# Patient Record
Sex: Male | Born: 1950 | Race: White | Hispanic: No | Marital: Single | Smoking: Current every day smoker
Health system: Southern US, Community
[De-identification: ages and names within clinical notes are randomized; demographics above are authoritative.]

## PROBLEM LIST (undated history)

## (undated) DIAGNOSIS — J449 Chronic obstructive pulmonary disease, unspecified: Secondary | ICD-10-CM

## (undated) DIAGNOSIS — R0600 Dyspnea, unspecified: Secondary | ICD-10-CM

## (undated) DIAGNOSIS — I2510 Atherosclerotic heart disease of native coronary artery without angina pectoris: Secondary | ICD-10-CM

## (undated) DIAGNOSIS — M1990 Unspecified osteoarthritis, unspecified site: Secondary | ICD-10-CM

## (undated) DIAGNOSIS — I700 Atherosclerosis of aorta: Secondary | ICD-10-CM

## (undated) DIAGNOSIS — Z9889 Other specified postprocedural states: Secondary | ICD-10-CM

## (undated) DIAGNOSIS — R278 Other lack of coordination: Secondary | ICD-10-CM

## (undated) DIAGNOSIS — Z7982 Long term (current) use of aspirin: Secondary | ICD-10-CM

## (undated) DIAGNOSIS — I6502 Occlusion and stenosis of left vertebral artery: Secondary | ICD-10-CM

## (undated) DIAGNOSIS — K4090 Unilateral inguinal hernia, without obstruction or gangrene, not specified as recurrent: Secondary | ICD-10-CM

## (undated) DIAGNOSIS — E7800 Pure hypercholesterolemia, unspecified: Secondary | ICD-10-CM

## (undated) DIAGNOSIS — L409 Psoriasis, unspecified: Secondary | ICD-10-CM

## (undated) DIAGNOSIS — I779 Disorder of arteries and arterioles, unspecified: Secondary | ICD-10-CM

## (undated) DIAGNOSIS — Z8673 Personal history of transient ischemic attack (TIA), and cerebral infarction without residual deficits: Secondary | ICD-10-CM

## (undated) HISTORY — DX: Psoriasis, unspecified: L40.9

## (undated) HISTORY — PX: TOTAL SHOULDER ARTHROPLASTY: SHX126

## (undated) HISTORY — DX: Occlusion and stenosis of left vertebral artery: I65.02

## (undated) HISTORY — DX: Unilateral inguinal hernia, without obstruction or gangrene, not specified as recurrent: K40.90

## (undated) HISTORY — DX: Other lack of coordination: R27.8

## (undated) HISTORY — PX: SHOULDER SURGERY: SHX246

## (undated) HISTORY — PX: BACK SURGERY: SHX140

## (undated) HISTORY — PX: GANGLION CYST EXCISION: SHX1691

---

## 2005-11-18 ENCOUNTER — Emergency Department: Payer: Self-pay | Admitting: Emergency Medicine

## 2008-02-03 ENCOUNTER — Emergency Department: Payer: Self-pay | Admitting: Emergency Medicine

## 2008-08-04 ENCOUNTER — Emergency Department: Payer: Self-pay | Admitting: Emergency Medicine

## 2009-04-22 ENCOUNTER — Emergency Department: Payer: Self-pay | Admitting: Emergency Medicine

## 2010-06-13 ENCOUNTER — Emergency Department: Payer: Self-pay | Admitting: Emergency Medicine

## 2010-07-08 ENCOUNTER — Emergency Department: Payer: Self-pay | Admitting: Emergency Medicine

## 2010-12-13 ENCOUNTER — Emergency Department: Payer: Self-pay | Admitting: Emergency Medicine

## 2011-01-08 ENCOUNTER — Emergency Department: Payer: Self-pay | Admitting: Emergency Medicine

## 2011-04-22 ENCOUNTER — Emergency Department (HOSPITAL_COMMUNITY)
Admission: EM | Admit: 2011-04-22 | Discharge: 2011-04-23 | Disposition: A | Discharge status: Home / Self Care | Payer: Self-pay | Attending: Emergency Medicine | Admitting: Emergency Medicine

## 2011-04-22 DIAGNOSIS — R21 Rash and other nonspecific skin eruption: Secondary | ICD-10-CM | POA: Insufficient documentation

## 2011-04-22 DIAGNOSIS — L2989 Other pruritus: Secondary | ICD-10-CM | POA: Insufficient documentation

## 2011-04-22 DIAGNOSIS — L298 Other pruritus: Secondary | ICD-10-CM | POA: Insufficient documentation

## 2011-04-30 ENCOUNTER — Emergency Department: Payer: Self-pay | Admitting: Emergency Medicine

## 2011-05-01 ENCOUNTER — Emergency Department (HOSPITAL_COMMUNITY)
Admission: EM | Admit: 2011-05-01 | Discharge: 2011-05-01 | Disposition: A | Discharge status: Home / Self Care | Payer: Self-pay | Attending: Emergency Medicine | Admitting: Emergency Medicine

## 2011-05-01 DIAGNOSIS — L02419 Cutaneous abscess of limb, unspecified: Secondary | ICD-10-CM | POA: Insufficient documentation

## 2011-05-01 DIAGNOSIS — L03119 Cellulitis of unspecified part of limb: Secondary | ICD-10-CM | POA: Insufficient documentation

## 2012-12-01 ENCOUNTER — Emergency Department: Payer: Self-pay | Admitting: Emergency Medicine

## 2012-12-26 ENCOUNTER — Emergency Department: Payer: Self-pay | Admitting: Internal Medicine

## 2012-12-26 LAB — URINALYSIS, COMPLETE
Bacteria: NONE SEEN
Bilirubin,UR: NEGATIVE
Ketone: NEGATIVE
Leukocyte Esterase: NEGATIVE
Ph: 5 (ref 4.5–8.0)
Protein: 30
Squamous Epithelial: NONE SEEN
WBC UR: 1 /HPF (ref 0–5)

## 2012-12-26 LAB — BASIC METABOLIC PANEL
Anion Gap: 7 (ref 7–16)
BUN: 9 mg/dL (ref 7–18)
Calcium, Total: 8.6 mg/dL (ref 8.5–10.1)
Chloride: 104 mmol/L (ref 98–107)
Creatinine: 0.96 mg/dL (ref 0.60–1.30)
Osmolality: 273 (ref 275–301)
Potassium: 3.1 mmol/L — ABNORMAL LOW (ref 3.5–5.1)
Sodium: 136 mmol/L (ref 136–145)

## 2012-12-26 LAB — CBC
HCT: 41.9 % (ref 40.0–52.0)
HGB: 14.3 g/dL (ref 13.0–18.0)
MCH: 32.4 pg (ref 26.0–34.0)
MCV: 95 fL (ref 80–100)
Platelet: 240 10*3/uL (ref 150–440)
RBC: 4.42 10*6/uL (ref 4.40–5.90)
WBC: 13.8 10*3/uL — ABNORMAL HIGH (ref 3.8–10.6)

## 2012-12-26 LAB — LIPASE, BLOOD: Lipase: 67 U/L — ABNORMAL LOW (ref 73–393)

## 2012-12-26 LAB — TROPONIN I: Troponin-I: <0.02 ng/mL

## 2013-07-19 ENCOUNTER — Emergency Department: Payer: Self-pay | Admitting: Emergency Medicine

## 2013-07-19 LAB — CBC
HGB: 13.8 g/dL (ref 13.0–18.0)
MCH: 33 pg (ref 26.0–34.0)
RDW: 13.4 % (ref 11.5–14.5)
WBC: 12.4 10*3/uL — ABNORMAL HIGH (ref 3.8–10.6)

## 2013-07-19 LAB — BASIC METABOLIC PANEL
Chloride: 101 mmol/L (ref 98–107)
EGFR (Non-African Amer.): >60
Glucose: 123 mg/dL — ABNORMAL HIGH (ref 65–99)
Osmolality: 269 (ref 275–301)
Potassium: 3.3 mmol/L — ABNORMAL LOW (ref 3.5–5.1)
Sodium: 135 mmol/L — ABNORMAL LOW (ref 136–145)

## 2015-06-11 ENCOUNTER — Emergency Department: Payer: Self-pay

## 2015-06-11 ENCOUNTER — Emergency Department
Admission: EM | Admit: 2015-06-11 | Discharge: 2015-06-11 | Disposition: A | Discharge status: Home / Self Care | Payer: Self-pay | Attending: Emergency Medicine | Admitting: Emergency Medicine

## 2015-06-11 ENCOUNTER — Encounter: Payer: Self-pay | Admitting: *Deleted

## 2015-06-11 DIAGNOSIS — M545 Low back pain, unspecified: Secondary | ICD-10-CM

## 2015-06-11 DIAGNOSIS — M5136 Other intervertebral disc degeneration, lumbar region: Secondary | ICD-10-CM | POA: Insufficient documentation

## 2015-06-11 DIAGNOSIS — Z720 Tobacco use: Secondary | ICD-10-CM | POA: Insufficient documentation

## 2015-06-11 LAB — URINALYSIS COMPLETE WITH MICROSCOPIC (ARMC ONLY)
BILIRUBIN URINE: NEGATIVE
Bacteria, UA: NONE SEEN
Glucose, UA: NEGATIVE mg/dL
Hgb urine dipstick: NEGATIVE
KETONES UR: NEGATIVE mg/dL
LEUKOCYTES UA: NEGATIVE
Nitrite: NEGATIVE
PROTEIN: NEGATIVE mg/dL
SPECIFIC GRAVITY, URINE: 1.003 — AB (ref 1.005–1.030)
Squamous Epithelial / LPF: NONE SEEN
pH: 7 (ref 5.0–8.0)

## 2015-06-11 MED ORDER — MELOXICAM 15 MG PO TABS: 15.0000 mg | ORAL_TABLET | Freq: Every day | ORAL | Status: DC

## 2015-06-11 MED ORDER — ORPHENADRINE CITRATE 30 MG/ML IJ SOLN
60.0000 mg | Freq: Two times a day (BID) | INTRAMUSCULAR | Status: DC
  Administered 2015-06-11: 60 mg via INTRAMUSCULAR
  Filled 2015-06-11: qty 2

## 2015-06-11 MED ORDER — TRAMADOL HCL 50 MG PO TABS: 50.0000 mg | ORAL_TABLET | Freq: Four times a day (QID) | ORAL | Status: DC | PRN

## 2015-06-11 MED ORDER — KETOROLAC TROMETHAMINE 60 MG/2ML IM SOLN
60.0000 mg | Freq: Once | INTRAMUSCULAR | Status: AC
  Administered 2015-06-11: 60 mg via INTRAMUSCULAR
  Filled 2015-06-11: qty 2

## 2015-06-11 MED ORDER — CYCLOBENZAPRINE HCL 10 MG PO TABS: 10.0000 mg | ORAL_TABLET | Freq: Three times a day (TID) | ORAL | Status: DC | PRN

## 2015-06-11 NOTE — Discharge Instructions (Signed)

## 2015-06-11 NOTE — ED Provider Notes (Signed)
[facility] Emergency Department Provider Note  ____________________________________________  Time seen: Approximately 8:46 PM  I have reviewed the triage vital signs and the nursing notes.   HISTORY  Chief Complaint Back Pain    HPI Tommy Avila is a 64 y.o. male patient complaining of right-sided back pain for 2 days. Patient denies any injury however there was a prolonged flexion incident and when he straighten out the pain started. Patient also complaining of urinary frequency status post onset of back pain. Patient denies any hematuria or abdominal pain. Patient is rating his pain as a 10 over 10. Patient is taking over-the-counter ibuprofen with no relief. Patient denies any bowel dysfunction. Patient denies any radicular component to this pain.   History reviewed. No pertinent past medical history.  There are no active problems to display for this patient.   History reviewed. No pertinent past surgical history.  No current outpatient prescriptions on file.  Allergies Review of patient's allergies indicates no known allergies.  History reviewed. No pertinent family history.  Social History Social History  Substance Use Topics  . Smoking status: Current Every Day Smoker  . Smokeless tobacco: None  . Alcohol Use: No    Review of Systems Constitutional: No fever/chills Eyes: No visual changes. ENT: No sore throat. Cardiovascular: Denies chest pain. Respiratory: Denies shortness of breath. Gastrointestinal: No abdominal pain.  No nausea, no vomiting.  No diarrhea.  No constipation. Genitourinary: Negative for dysuria. Musculoskeletal: Acute right back pain Skin: Negative for rash. Neurological: Negative for headaches, focal weakness or numbness. 10-point ROS otherwise negative.  ____________________________________________   PHYSICAL EXAM:  VITAL SIGNS: ED Triage Vitals  Enc Vitals Group     BP 06/11/15 2025 130/68 mmHg      Pulse Rate 06/11/15 2025 64     Resp 06/11/15 2025 20     Temp 06/11/15 2025 98 F (36.7 C)     Temp Source 06/11/15 2025 Oral     SpO2 06/11/15 2025 99 %     Weight 06/11/15 2025 150 lb (68.04 kg)     Height 06/11/15 2025 6\' 1"  (1.854 m)     Head Cir --      Peak Flow --      Pain Score 06/11/15 2027 10     Pain Loc --      Pain Edu? --      Excl. in [place]? --     Constitutional: Alert and oriented. Well appearing and in no acute distress. Eyes: Conjunctivae are normal. PERRL. EOMI. Head: Atraumatic. Nose: No congestion/rhinnorhea. Mouth/Throat: Mucous membranes are moist.  Oropharynx non-erythematous. Neck: No stridor.  No cervical spine tenderness to palpation. Hematological/Lymphatic/Immunilogical: No cervical lymphadenopathy. Cardiovascular: Normal rate, regular rhythm. Grossly normal heart sounds.  Good peripheral circulation. Respiratory: Normal respiratory effort.  No retractions. Lungs CTAB. Gastrointestinal: Soft and nontender. No distention. No abdominal bruits. No CVA tenderness. Musculoskeletal: No spinal deformity. Patient decreased range of motion with extension of the L-spine. Patient has some moderate guarding to palpation L3-L5. Left paraspinal muscle spasm with attempt to come to fully extension.. Neurologic:  Normal speech and language. No gross focal neurologic deficits are appreciated. No gait instability. Skin:  Skin is warm, dry and intact. No rash noted. Psychiatric: Mood and affect are normal. Speech and behavior are normal.  ____________________________________________   LABS (all labs ordered are listed, but only abnormal results are displayed)  Labs Reviewed  URINALYSIS COMPLETEWITH MICROSCOPIC ([place]) - Abnormal; Notable for the following:  Color, Urine STRAW (*)    APPearance CLEAR (*)    Specific Gravity, Urine 1.003 (*)    All other components within normal limits    ____________________________________________  EKG   ____________________________________________  RADIOLOGY No acute findings. Mild multilevel degenerative disc disease. I, Sable Feil, personally viewed and evaluated these images (plain radiographs) as part of my medical decision making.   ____________________________________________   PROCEDURES  Procedure(s) performed: None  Critical Care performed: No  ____________________________________________   INITIAL IMPRESSION / ASSESSMENT AND PLAN / ED COURSE  Pertinent labs & imaging results that were available during my care of the patient were reviewed by me and considered in my medical decision making (see chart for details).  Acute low back pain with mild degenerative disc disease. Patient get a prescription for Motrin, Flexeril, and tramadol. Patient advised follow-up with family doctor for continued care. ____________________________________________   FINAL CLINICAL IMPRESSION(S) / ED DIAGNOSES  Final diagnoses:  Back pain at L4-L5 level      Sable Feil, PA-C 06/11/15 2146  Lavonia Drafts, MD 06/11/15 2256

## 2015-06-11 NOTE — ED Notes (Signed)
Pt reports right side back pain.  Pt also reports urinary frequency.  No abd pain. Pt denies injury to back.

## 2016-10-05 ENCOUNTER — Telehealth: Payer: Self-pay

## 2016-10-05 ENCOUNTER — Other Ambulatory Visit: Payer: Self-pay | Admitting: Family Medicine

## 2016-10-05 DIAGNOSIS — Z87891 Personal history of nicotine dependence: Secondary | ICD-10-CM

## 2016-10-05 NOTE — Telephone Encounter (Signed)
Received referral from pt PCP to schedule. L MOM to call back and schedule.

## 2016-10-07 ENCOUNTER — Ambulatory Visit
Admission: RE | Admit: 2016-10-07 | Discharge: 2016-10-07 | Disposition: A | Discharge status: Home / Self Care | Payer: Medicare HMO | Source: Ambulatory Visit | Attending: Family Medicine | Admitting: Family Medicine

## 2016-10-07 DIAGNOSIS — I77819 Aortic ectasia, unspecified site: Secondary | ICD-10-CM | POA: Diagnosis not present

## 2016-10-07 DIAGNOSIS — I700 Atherosclerosis of aorta: Secondary | ICD-10-CM | POA: Insufficient documentation

## 2016-10-07 DIAGNOSIS — Z87891 Personal history of nicotine dependence: Secondary | ICD-10-CM | POA: Diagnosis not present

## 2016-10-13 ENCOUNTER — Ambulatory Visit: Payer: Self-pay | Admitting: Cardiology

## 2016-10-13 ENCOUNTER — Encounter: Payer: Self-pay | Admitting: *Deleted

## 2017-02-05 ENCOUNTER — Emergency Department: Payer: Medicare HMO

## 2017-02-05 DIAGNOSIS — Z5321 Procedure and treatment not carried out due to patient leaving prior to being seen by health care provider: Secondary | ICD-10-CM | POA: Insufficient documentation

## 2017-02-05 DIAGNOSIS — R079 Chest pain, unspecified: Secondary | ICD-10-CM | POA: Insufficient documentation

## 2017-02-05 LAB — CBC
HEMATOCRIT: 40.5 % (ref 40.0–52.0)
Hemoglobin: 14.1 g/dL (ref 13.0–18.0)
MCH: 33.1 pg (ref 26.0–34.0)
MCHC: 34.7 g/dL (ref 32.0–36.0)
MCV: 95.5 fL (ref 80.0–100.0)
PLATELETS: 257 10*3/uL (ref 150–440)
RBC: 4.24 MIL/uL — ABNORMAL LOW (ref 4.40–5.90)
RDW: 12.9 % (ref 11.5–14.5)
WBC: 10.6 10*3/uL (ref 3.8–10.6)

## 2017-02-05 LAB — BASIC METABOLIC PANEL
Anion gap: 6 (ref 5–15)
BUN: 15 mg/dL (ref 6–20)
CHLORIDE: 104 mmol/L (ref 101–111)
CO2: 28 mmol/L (ref 22–32)
CREATININE: 0.97 mg/dL (ref 0.61–1.24)
Calcium: 9 mg/dL (ref 8.9–10.3)
GFR calc Af Amer: >60 mL/min (ref >60)
GFR calc non Af Amer: >60 mL/min (ref >60)
GLUCOSE: 110 mg/dL — AB (ref 65–99)
POTASSIUM: 3.3 mmol/L — AB (ref 3.5–5.1)
SODIUM: 138 mmol/L (ref 135–145)

## 2017-02-05 LAB — TROPONIN I: Troponin I: <0.03 ng/mL (ref <0.03)

## 2017-02-05 NOTE — ED Triage Notes (Signed)
Reports sudden onset of left sided chest pain that radiates into his left arm.  Reports chest pain as pressure and that left arm went numb.

## 2017-02-06 ENCOUNTER — Emergency Department
Admission: EM | Admit: 2017-02-06 | Discharge: 2017-02-06 | Disposition: A | Discharge status: Home / Self Care | Payer: Medicare HMO | Attending: Emergency Medicine | Admitting: Emergency Medicine

## 2017-11-02 ENCOUNTER — Encounter: Payer: Self-pay | Admitting: General Surgery

## 2017-11-13 ENCOUNTER — Other Ambulatory Visit: Payer: Self-pay

## 2017-11-13 ENCOUNTER — Emergency Department: Payer: Medicare HMO

## 2017-11-13 ENCOUNTER — Encounter: Payer: Self-pay | Admitting: Emergency Medicine

## 2017-11-13 ENCOUNTER — Emergency Department
Admission: EM | Admit: 2017-11-13 | Discharge: 2017-11-13 | Disposition: A | Discharge status: Home / Self Care | Payer: Medicare HMO | Attending: Emergency Medicine | Admitting: Emergency Medicine

## 2017-11-13 DIAGNOSIS — M545 Low back pain, unspecified: Secondary | ICD-10-CM

## 2017-11-13 DIAGNOSIS — F17200 Nicotine dependence, unspecified, uncomplicated: Secondary | ICD-10-CM | POA: Diagnosis not present

## 2017-11-13 LAB — URINALYSIS, COMPLETE (UACMP) WITH MICROSCOPIC
BACTERIA UA: NONE SEEN
Bilirubin Urine: NEGATIVE
Glucose, UA: NEGATIVE mg/dL
Ketones, ur: NEGATIVE mg/dL
Leukocytes, UA: NEGATIVE
Nitrite: NEGATIVE
PH: 5 (ref 5.0–8.0)
Protein, ur: NEGATIVE mg/dL
SPECIFIC GRAVITY, URINE: 1.01 (ref 1.005–1.030)
SQUAMOUS EPITHELIAL / LPF: NONE SEEN

## 2017-11-13 MED ORDER — CYCLOBENZAPRINE HCL 5 MG PO TABS: ORAL_TABLET | ORAL | 0 refills | Status: DC

## 2017-11-13 MED ORDER — MELOXICAM 15 MG PO TABS: 15.0000 mg | ORAL_TABLET | Freq: Every day | ORAL | 0 refills | Status: AC

## 2017-11-13 NOTE — ED Provider Notes (Signed)
[facility] Emergency Department Provider Note  ____________________________________________  Time seen: Approximately 1:44 PM  I have reviewed the triage vital signs and the nursing notes.   HISTORY  Chief Complaint Back Pain    HPI Tommy Avila is a 67 y.o. male that presents to the emergency department for evaluation of bilateral low back pain for 7 days.  Pain occasionally radiates into his right hip.  He does not have any hip pain currently.  No trauma.  Patient has had 2 back surgeries in the past.  He was urinating frequently last night.  No bowel or bladder dysfunction or saddle paresthesias.  No alleviating measures have been attempted.  No fever, chills, nausea, vomiting, abdominal pain, flank pain, hematuria, diarrhea, constipation.   History reviewed. No pertinent past medical history.  There are no active problems to display for this patient.   Past Surgical History:  Procedure Laterality Date  . BACK SURGERY      Prior to Admission medications   Medication Sig Start Date End Date Taking? Authorizing Provider  cyclobenzaprine (FLEXERIL) 5 MG tablet Take 1-2 tablets 3 times daily as needed 11/13/17   Laban Emperor, PA-C  meloxicam (MOBIC) 15 MG tablet Take 1 tablet (15 mg total) by mouth daily for 10 days. 11/13/17 11/23/17  Laban Emperor, PA-C  traMADol (ULTRAM) 50 MG tablet Take 1 tablet (50 mg total) by mouth every 6 (six) hours as needed for moderate pain. 06/11/15   Sable Feil, PA-C    Allergies Patient has no known allergies.  No family history on file.  Social History Social History   Tobacco Use  . Smoking status: Current Every Day Smoker  . Smokeless tobacco: Never Used  Substance Use Topics  . Alcohol use: No  . Drug use: Not on file     Review of Systems  Cardiovascular: No chest pain. Respiratory:  No SOB. Gastrointestinal: No abdominal pain.  No nausea, no vomiting.  Musculoskeletal: Positive for back and  hip pain. Skin: Negative for rash, abrasions, lacerations, ecchymosis. Neurological: Negative for headaches, numbness or tingling   ____________________________________________   PHYSICAL EXAM:  VITAL SIGNS: ED Triage Vitals  Enc Vitals Group     BP 11/13/17 1225 124/72     Pulse Rate 11/13/17 1225 62     Resp 11/13/17 1225 16     Temp --      Temp src --      SpO2 11/13/17 1225 100 %     Weight 11/13/17 1226 150 lb (68 kg)     Height 11/13/17 1226 6\' 1"  (1.854 m)     Head Circumference --      Peak Flow --      Pain Score 11/13/17 1226 9     Pain Loc --      Pain Edu? --      Excl. in [place]? --      Constitutional: Alert and oriented. Well appearing and in no acute distress. Eyes: Conjunctivae are normal. PERRL. EOMI. Head: Atraumatic. ENT:      Ears:      Nose: No congestion/rhinnorhea.      Mouth/Throat: Mucous membranes are moist.  Neck: No stridor.  Cardiovascular: Normal rate, regular rhythm.  Good peripheral circulation. Respiratory: Normal respiratory effort without tachypnea or retractions. Lungs CTAB. Good air entry to the bases with no decreased or absent breath sounds. Gastrointestinal: Bowel sounds 4 quadrants. Soft and nontender to palpation. No guarding or rigidity. No palpable masses. No  distention. No CVA tenderness. Musculoskeletal: Full range of motion to all extremities. No gross deformities appreciated.  Lumbar paraspinal tenderness to palpation.  Strength equal in lower extremities bilaterally.  Normal gait. Neurologic:  Normal speech and language. No gross focal neurologic deficits are appreciated.  Skin:  Skin is warm, dry and intact. No rash noted. Psychiatric: Mood and affect are normal. Speech and behavior are normal. Patient exhibits appropriate insight and judgement.   ____________________________________________   LABS (all labs ordered are listed, but only abnormal results are displayed)  Labs Reviewed  URINALYSIS, COMPLETE (UACMP)  WITH MICROSCOPIC - Abnormal; Notable for the following components:      Result Value   Color, Urine STRAW (*)    APPearance CLEAR (*)    Hgb urine dipstick SMALL (*)    All other components within normal limits   ____________________________________________  EKG   ____________________________________________  RADIOLOGY Robinette Haines, personally viewed and evaluated these images (plain radiographs) as part of my medical decision making, as well as reviewing the written report by the radiologist.  Dg Lumbar Spine Complete  Result Date: 11/13/2017 CLINICAL DATA:  Low back pain beginning 1 week ago.  Right hip pain. EXAM: LUMBAR SPINE - COMPLETE 4+ VIEW COMPARISON:  06/11/2015 FINDINGS: Normal alignment. Chronic disc space narrowing at L3-4, L4-5 and L5-S1 with marginal osteophytes. Chronic lower lumbar facet osteoarthritis. No acute finding. IMPRESSION: No acute finding. 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 Electronically Signed   By: Nelson Chimes M.D.   On: 11/13/2017 14:45    ____________________________________________    PROCEDURES  Procedure(s) performed:    Procedures    Medications - No data to display   ____________________________________________   INITIAL IMPRESSION / ASSESSMENT AND PLAN / ED COURSE  Pertinent labs & imaging results that were available during my care of the patient were reviewed by me and considered in my medical decision making (see chart for details).  Review of the [place] CSRS was performed in accordance of the [place] prior to dispensing any controlled drugs.     Patient presented to emergency department for evaluation of low back pain for 1 week.  Vital signs and exam are reassuring.  X-ray consistent with chronic changes.  No bowel or bladder dysfunction or saddle paresthesias.  Patient is up walking comfortably in ED.  Urinalysis shows small hgb and patient will follow up with PCP for repeat  urinalysis.  Symptoms are not consistent with kidney stone. Patient will be discharged home with prescriptions for flexeril and mobic. Patient is to follow up with PCP as directed. Patient is given ED precautions to return to the ED for any worsening or new symptoms.     ____________________________________________  FINAL CLINICAL IMPRESSION(S) / ED DIAGNOSES  Final diagnoses:  Acute bilateral low back pain without sciatica      NEW MEDICATIONS STARTED DURING THIS VISIT:  ED Discharge Orders        Ordered    meloxicam (MOBIC) 15 MG tablet  Daily     11/13/17 1514    cyclobenzaprine (FLEXERIL) 5 MG tablet     11/13/17 1514          This chart was dictated using voice recognition software/Dragon. Despite best efforts to proofread, errors can occur which can change the meaning. Any change was purely unintentional.    Laban Emperor, PA-C 11/13/17 1647    Earleen Newport, MD 11/14/17 [phone]

## 2017-11-13 NOTE — ED Triage Notes (Signed)
Says low back pain since last Monday.  Says he does not recall injury.  Says pain in hip on right as well.

## 2018-02-14 ENCOUNTER — Other Ambulatory Visit: Payer: Self-pay

## 2018-02-14 DIAGNOSIS — Z1211 Encounter for screening for malignant neoplasm of colon: Secondary | ICD-10-CM

## 2018-02-20 ENCOUNTER — Other Ambulatory Visit: Payer: Self-pay

## 2018-03-02 ENCOUNTER — Telehealth: Payer: Self-pay

## 2018-03-02 NOTE — Telephone Encounter (Signed)
Contacted pt to reschedule colonoscopy.  He has agreed to reschedule to 03/26/18. Kieth Brightly in Endo has been informed, updated referral.  Thanks Sharyn Lull

## 2018-03-23 ENCOUNTER — Encounter: Payer: Self-pay | Admitting: Emergency Medicine

## 2018-03-26 ENCOUNTER — Encounter: Admission: RE | Payer: Self-pay | Source: Ambulatory Visit

## 2018-03-26 ENCOUNTER — Ambulatory Visit: Admission: RE | Admit: 2018-03-26 | Payer: Medicare HMO | Source: Ambulatory Visit | Admitting: Gastroenterology

## 2018-03-26 SURGERY — COLONOSCOPY WITH PROPOFOL
Anesthesia: General

## 2018-07-24 ENCOUNTER — Other Ambulatory Visit: Payer: Self-pay | Admitting: Family Medicine

## 2018-07-24 DIAGNOSIS — Z0000 Encounter for general adult medical examination without abnormal findings: Secondary | ICD-10-CM

## 2018-09-04 ENCOUNTER — Other Ambulatory Visit: Payer: Self-pay

## 2018-09-04 ENCOUNTER — Emergency Department: Payer: Medicare HMO

## 2018-09-04 ENCOUNTER — Encounter: Payer: Self-pay | Admitting: Emergency Medicine

## 2018-09-04 ENCOUNTER — Emergency Department
Admission: EM | Admit: 2018-09-04 | Discharge: 2018-09-04 | Disposition: A | Discharge status: Home / Self Care | Payer: Medicare HMO | Attending: Emergency Medicine | Admitting: Emergency Medicine

## 2018-09-04 DIAGNOSIS — F17210 Nicotine dependence, cigarettes, uncomplicated: Secondary | ICD-10-CM | POA: Insufficient documentation

## 2018-09-04 DIAGNOSIS — M25512 Pain in left shoulder: Secondary | ICD-10-CM | POA: Diagnosis not present

## 2018-09-04 DIAGNOSIS — J449 Chronic obstructive pulmonary disease, unspecified: Secondary | ICD-10-CM | POA: Diagnosis not present

## 2018-09-04 HISTORY — DX: Chronic obstructive pulmonary disease, unspecified: J44.9

## 2018-09-04 MED ORDER — TRAMADOL HCL 50 MG PO TABS: 50.0000 mg | ORAL_TABLET | Freq: Four times a day (QID) | ORAL | 0 refills | Status: DC | PRN

## 2018-09-04 MED ORDER — PREDNISONE 10 MG (21) PO TBPK: ORAL_TABLET | ORAL | 0 refills | Status: DC

## 2018-09-04 NOTE — Discharge Instructions (Addendum)
Follow-up with your regular doctor if not better in 5 to 7 days.  Or you may see [facility] orth[facility].  He is call for appointment.  Take medications as prescribed.  Apply ice to the left shoulder.  Return to the emergency department if worsening.

## 2018-09-04 NOTE — ED Triage Notes (Addendum)
Pt c/o left shoulder pain for 3 weeks. Pain worse with movement.  Minimal ROM r/t pain.  No known injury.  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  Has had difficulty working r/o pain.  Has been using right hand/arm.  When pt raises arm at shoulder feels pain in the joint.  No chest pain or SHOB.

## 2018-09-04 NOTE — ED Provider Notes (Signed)
[facility] Emergency Department Provider Note  ____________________________________________   First MD Initiated Contact with Patient 09/04/18 1353     (approximate)  I have reviewed the triage vital signs and the nursing notes.   HISTORY  Chief Complaint Shoulder Pain    HPI JENSON BEEDLE is a 68 y.o. male presents emergency department complaining of left shoulder pain for 3 weeks.  Pain is worse with movement.  States he cannot reach overhead or across his chest.  States he has to lift the arm with his other hand at night when he rolls over on it.  States pain is located directly in the joint.  He denies any chest pain/shortness of breath.  No vomiting or diarrhea.    Past Medical History:  Diagnosis Date  . COPD (chronic obstructive pulmonary disease) (HCC)     There are no active problems to display for this patient.   Past Surgical History:  Procedure Laterality Date  . BACK SURGERY      Prior to Admission medications   Medication Sig Start Date End Date Taking? Authorizing Provider  predniSONE (STERAPRED UNI-PAK 21 TAB) 10 MG (21) TBPK tablet Take 6 pills on day one then decrease by 1 pill each day 09/04/18   Versie Starks, PA-C  traMADol (ULTRAM) 50 MG tablet Take 1 tablet (50 mg total) by mouth every 6 (six) hours as needed for moderate pain. 09/04/18   Versie Starks, PA-C    Allergies Patient has no known allergies.  History reviewed. No pertinent family history.  Social History Social History   Tobacco Use  . Smoking status: Current Every Day Smoker  . Smokeless tobacco: Never Used  Substance Use Topics  . Alcohol use: No  . Drug use: Not on file    Review of Systems  Constitutional: No fever/chills Eyes: No visual changes. ENT: No sore throat. Respiratory: Denies cough Genitourinary: Negative for dysuria. Musculoskeletal: Negative for back pain.  Positive for left shoulder pain Skin: Negative for  rash.    ____________________________________________   PHYSICAL EXAM:  VITAL SIGNS: ED Triage Vitals  Enc Vitals Group     BP 09/04/18 1340 115/74     Pulse Rate 09/04/18 1340 82     Resp 09/04/18 1340 16     Temp 09/04/18 1340 97.6 F (36.4 C)     Temp Source 09/04/18 1340 Oral     SpO2 09/04/18 1340 98 %     Weight 09/04/18 1337 155 lb (70.3 kg)     Height 09/04/18 1337 6' (1.829 m)     Head Circumference --      Peak Flow --      Pain Score 09/04/18 1337 8     Pain Loc --      Pain Edu? --      Excl. in [place]? --     Constitutional: Alert and oriented. Well appearing and in no acute distress. Eyes: Conjunctivae are normal.  Head: Atraumatic. Nose: No congestion/rhinnorhea. Mouth/Throat: Mucous membranes are moist.   Neck:  supple no lymphadenopathy noted Cardiovascular: Normal rate, regular rhythm. Heart sounds are normal Respiratory: Normal respiratory effort.  No retractions, lungs c t a  GU: deferred Musculoskeletal: FROM all extremities, warm and well perfused, pain is reproduced with overhead reach and abduction.  Positive Hawkins sign.  The joint is tender anteriorly and at the joint space laterally.  Neurovascular is intact. Neurologic:  Normal speech and language.  Skin:  Skin is warm, dry  and intact. No rash noted. Psychiatric: Mood and affect are normal. Speech and behavior are normal.  ____________________________________________   LABS (all labs ordered are listed, but only abnormal results are displayed)  Labs Reviewed - No data to display ____________________________________________   ____________________________________________  RADIOLOGY  X-ray of the left shoulder is negative  ____________________________________________   PROCEDURES  Procedure(s) performed: EKG from triage shows normal sinus rhythm  Procedures    ____________________________________________   INITIAL IMPRESSION / ASSESSMENT AND PLAN / ED COURSE  Pertinent  labs & imaging results that were available during my care of the patient were reviewed by me and considered in my medical decision making (see chart for details).   Patient is a 68 year old male presents emergency department complaint of left shoulder pain.  For 3 weeks  Physical exam shows tenderness at the left shoulder anteriorly and laterally.  Positive Hawkins sign.  Pain is reproduced with overhead reach.  X-ray left shoulder is negative EKG from triage shows normal sinus rhythm  Explained the findings to the patient.  He was given a prescription for Sterapred and tramadol.  He is to follow-up with orthopedics.  Apply ice to the left shoulder.  Perform range of motion exercises as to decrease the chances of a frozen shoulder.  He states he understands and will comply.  He was discharged in stable condition.     As part of my medical decision making, I reviewed the following data within the [place] notes reviewed and incorporated, EKG interpreted NSR, Old chart reviewed, Radiograph reviewed left shoulder x-ray is negative, Notes from prior ED visits and [place] Controlled Substance Database  ____________________________________________   FINAL CLINICAL IMPRESSION(S) / ED DIAGNOSES  Final diagnoses:  Acute pain of left shoulder      NEW MEDICATIONS STARTED DURING THIS VISIT:  New Prescriptions   PREDNISONE (STERAPRED UNI-PAK 21 TAB) 10 MG (21) TBPK TABLET    Take 6 pills on day one then decrease by 1 pill each day     Note:  This document was prepared using Dragon voice recognition software and may include unintentional dictation errors.    Versie Starks, PA-C 09/04/18 1449    Eula Listen, MD 09/04/18 [phone]

## 2018-09-11 ENCOUNTER — Other Ambulatory Visit: Payer: Medicare HMO

## 2019-04-25 ENCOUNTER — Ambulatory Visit: Payer: Self-pay | Admitting: General Surgery

## 2019-05-02 ENCOUNTER — Encounter: Payer: Self-pay | Admitting: General Surgery

## 2019-05-02 ENCOUNTER — Other Ambulatory Visit: Payer: Self-pay

## 2019-05-02 ENCOUNTER — Ambulatory Visit (INDEPENDENT_AMBULATORY_CARE_PROVIDER_SITE_OTHER): Payer: Medicare HMO | Admitting: General Surgery

## 2019-05-02 VITALS — BP 121/77 | HR 68 | Temp 97.2°F | Ht 73.0 in | Wt 151.2 lb

## 2019-05-02 DIAGNOSIS — K4090 Unilateral inguinal hernia, without obstruction or gangrene, not specified as recurrent: Secondary | ICD-10-CM

## 2019-05-02 NOTE — Patient Instructions (Addendum)
You have chose to have your hernia repaired. This will be done by Dr. Celine Ahr at [facility].  Please see your (blue) Pre-care information that you have been given today.  You will need to arrange to be out of work for 2 weeks and then return with a lifting restrictions for 4 more weeks. Please send any FMLA paperwork prior to surgery and we will fill this out and fax it back to your employer within 3 business days.  You may have a bruise in your groin and also swelling and brusing in your testicle area. You may use ice 4-5 times daily for 15-20 minutes each time. Make sure that you place a barrier between you and the ice pack. To decrease the swelling, you may roll up a bath towel and place it vertically in between your thighs with your testicles resting on the towel. You will want to keep this area elevated as much as possible for several days following surgery.    Inguinal Hernia, Adult Muscles help keep everything in the body in its proper place. But if a weak spot in the muscles develops, something can poke through. That is called a hernia. When this happens in the lower part of the belly (abdomen), it is called an inguinal hernia. (It takes its name from a part of the body in this region called the inguinal canal.) A weak spot in the wall of muscles lets some fat or part of the small intestine bulge through. An inguinal hernia can develop at any age. Men get them more often than women. CAUSES  In adults, an inguinal hernia develops over time.  It can be triggered by:  Suddenly straining the muscles of the lower abdomen.  Lifting heavy objects.  Straining to have a bowel movement. Difficult bowel movements (constipation) can lead to this.  Constant coughing. This may be caused by smoking or lung disease.  Being overweight.  Being pregnant.  Working at a job that requires long periods of standing or heavy lifting.  Having had an inguinal hernia before. One type can be an emergency  situation. It is called a strangulated inguinal hernia. It develops if part of the small intestine slips through the weak spot and cannot get back into the abdomen. The blood supply can be cut off. If that happens, part of the intestine may die. This situation requires emergency surgery. SYMPTOMS  Often, a small inguinal hernia has no symptoms. It is found when a healthcare provider does a physical exam. Larger hernias usually have symptoms.   In adults, symptoms may include:  A lump in the groin. This is easier to see when the person is standing. It might disappear when lying down.  In men, a lump in the scrotum.  Pain or burning in the groin. This occurs especially when lifting, straining or coughing.  A dull ache or feeling of pressure in the groin.  Signs of a strangulated hernia can include:  A bulge in the groin that becomes very painful and tender to the touch.  A bulge that turns red or purple.  Fever, nausea and vomiting.  Inability to have a bowel movement or to pass gas. DIAGNOSIS  To decide if you have an inguinal hernia, a healthcare provider will probably do a physical examination.  This will include asking questions about any symptoms you have noticed.  The healthcare provider might feel the groin area and ask you to cough. If an inguinal hernia is felt, the healthcare provider may try  to slide it back into the abdomen.  Usually no other tests are needed. TREATMENT  Treatments can vary. The size of the hernia makes a difference. Options include:  Watchful waiting. This is often suggested if the hernia is small and you have had no symptoms.  No medical procedure will be done unless symptoms develop.  You will need to watch closely for symptoms. If any occur, contact your healthcare provider right away.  Surgery. This is used if the hernia is larger or you have symptoms.  Open surgery. This is usually an outpatient procedure (you will not stay overnight in a  hospital). An cut (incision) is made through the skin in the groin. The hernia is put back inside the abdomen. The weak area in the muscles is then repaired by herniorrhaphy or hernioplasty. Herniorrhaphy: in this type of surgery, the weak muscles are sewn back together. Hernioplasty: a patch or mesh is used to close the weak area in the abdominal wall.  Laparoscopy. In this procedure, a surgeon makes small incisions. A thin tube with a tiny video camera (called a laparoscope) is put into the abdomen. The surgeon repairs the hernia with mesh by looking with the video camera and using two long instruments. HOME CARE INSTRUCTIONS   After surgery to repair an inguinal hernia:  You will need to take pain medicine prescribed by your healthcare provider. Follow all directions carefully.  You will need to take care of the wound from the incision.  Your activity will be restricted for awhile. This will probably include no heavy lifting for several weeks. You also should not do anything too active for a few weeks. When you can return to work will depend on the type of job that you have.  During "watchful waiting" periods, you should:  Maintain a healthy weight.  Eat a diet high in fiber (fruits, vegetables and whole grains).  Drink plenty of fluids to avoid constipation. This means drinking enough water and other liquids to keep your urine clear or pale yellow.  Do not lift heavy objects.  Do not stand for long periods of time.  Quit smoking. This should keep you from developing a frequent cough. SEEK MEDICAL CARE IF:   A bulge develops in your groin area.  You feel pain, a burning sensation or pressure in the groin. This might be worse if you are lifting or straining.  You develop a fever of more than 100.5 F (38.1 C). SEEK IMMEDIATE MEDICAL CARE IF:   Pain in the groin increases suddenly.  A bulge in the groin gets bigger suddenly and does not go down.  For men, there is sudden pain  in the scrotum. Or, the size of the scrotum increases.  A bulge in the groin area becomes red or purple and is painful to touch.  You have nausea or vomiting that does not go away.  You feel your heart beating much faster than normal.  You cannot have a bowel movement or pass gas.  You develop a fever of more than 102.0 F (38.9 C).   This information is not intended to replace advice given to you by your health care provider. Make sure you discuss any questions you have with your health care provider.   Document Released: 12/18/2008 Document Revised: 10/24/2011 Document Reviewed: 02/02/2015 Elsevier Interactive Patient Education 2016 Elsevier Inc.   Inguinal Hernia, Adult An inguinal hernia is when fat or your intestines push through a weak spot in a muscle where your leg  meets your lower belly (groin). This causes a rounded lump (bulge). This kind of hernia could also be:  In your scrotum, if you are male.  In folds of skin around your vagina, if you are male. There are three types of inguinal hernias. These include:  Hernias that can be pushed back into the belly (are reducible). This type rarely causes pain.  Hernias that cannot be pushed back into the belly (are incarcerated).  Hernias that cannot be pushed back into the belly and lose their blood supply (are strangulated). This type needs emergency surgery. If you do not have symptoms, you may not need treatment. If you have symptoms or a large hernia, you may need surgery. Follow these instructions at home: Lifestyle  Do these things if told by your doctor so you do not have trouble pooping (constipation): ? Drink enough fluid to keep your pee (urine) pale yellow. ? Eat foods that have a lot of fiber. These include fresh fruits and vegetables, whole grains, and beans. ? Limit foods that are high in fat and processed sugars. These include foods that are fried or sweet. ? Take medicine for trouble pooping.  Avoid  lifting heavy objects.  Avoid standing for long amounts of time.  Do not use any products that contain nicotine or tobacco. These include cigarettes and e-cigarettes. If you need help quitting, ask your doctor.  Stay at a healthy weight. General instructions  You may try to push your hernia in by very gently pressing on it when you are lying down. Do not try to force the bulge back in if it will not push in easily.  Watch your hernia for any changes in shape, size, or color. Tell your doctor if you see any changes.  Take over-the-counter and prescription medicines only as told by your doctor.  Keep all follow-up visits as told by your doctor. This is important. Contact a doctor if:  You have a fever.  You have new symptoms.  Your symptoms get worse. Get help right away if:  The area where your leg meets your lower belly has: ? Pain that gets worse suddenly. ? A bulge that gets bigger suddenly, and it does not get smaller after that. ? A bulge that turns red or purple. ? A bulge that is painful when you touch it.  You are a man, and your scrotum: ? Suddenly feels painful. ? Suddenly changes in size.  You cannot push the hernia in by very gently pressing on it when you are lying down. Do not try to force the bulge back in if it will not push in easily.  You feel sick to your stomach (nauseous), and that feeling does not go away.  You throw up (vomit), and that keeps happening.  You have a fast heartbeat.  You cannot poop (have a bowel movement) or pass gas. These symptoms may be an emergency. Do not wait to see if the symptoms will go away. Get medical help right away. Call your local emergency services (911 in the [place] Summary  An inguinal hernia is when fat or your intestines push through a weak spot in a muscle where your leg meets your lower belly (groin). This causes a rounded lump (bulge).  If you do not have symptoms, you may not need treatment. If you have  symptoms or a large hernia, you may need surgery.  Avoid lifting heavy objects. Also avoid standing for long amounts of time.  Do not try to force  the bulge back in if it will not push in easily. This information is not intended to replace advice given to you by your health care provider. Make sure you discuss any questions you have with your health care provider. Document Released: 09/01/2006 Document Revised: 09/02/2017 Document Reviewed: 05/03/2017 Elsevier Patient Education  2020 [employer].

## 2019-05-02 NOTE — Progress Notes (Signed)
Patient ID: Tommy Avila, male   DOB: 1951/03/18, 68 y.o.   MRN: [id number]  Chief Complaint  Patient presents with  . New Patient (Initial Visit)    left inguinal hernia    HPI Tommy Avila is a 68 y.o. male.   He has been referred for surgical evaluation of a left inguinal hernia.  He says that approximately a month ago, he noticed a bulge in his left groin.  He denies having been lifting or straining in any way.  He says that the bulge does not seem to ever go away.  It is occasionally painful.  He denies any constipation.  No nausea or vomiting.  No difficulty with urination.  It is bothering him and he is interested in surgical repair.   Past Medical History:  Diagnosis Date  . COPD (chronic obstructive pulmonary disease) ([place])   . Inguinal hernia    left  . Psoriasis   . Sensory ataxia   . Vertebral artery stenosis, left     Past Surgical History:  Procedure Laterality Date  . BACK SURGERY    . SHOULDER SURGERY Left     Family History  Problem Relation Age of Onset  . Heart attack Mother   . Emphysema Sister   . Prostate cancer Brother     Social History Social History   Tobacco Use  . Smoking status: Current Every Day Smoker    Packs/day: 0.50    Types: Cigarettes  . Smokeless tobacco: Never Used  Substance Use Topics  . Alcohol use: No  . Drug use: Not on file    Allergies  Allergen Reactions  . Oxycodone     Current Outpatient Medications  Medication Sig Dispense Refill  . aspirin EC 81 MG tablet Take by mouth.    . traMADol (ULTRAM) 50 MG tablet Take 1 tablet (50 mg total) by mouth every 6 (six) hours as needed for moderate pain. 12 tablet 0   No current facility-administered medications for this visit.     Review of Systems Review of Systems  All other systems reviewed and are negative.   Blood pressure 121/77, pulse 68, temperature (!) 97.2 F (36.2 C), height 6\' 1"  (1.854 m), weight 151 lb 3.2 oz (68.6 kg), SpO2 96 %. Today's  Vitals   05/02/19 1426  BP: 121/77  Pulse: 68  Temp: (!) 97.2 F (36.2 C)  SpO2: 96%  Weight: 151 lb 3.2 oz (68.6 kg)  Height: 6\' 1"  (1.854 m)   Body mass index is 19.95 kg/m.  Physical Exam Physical Exam Exam conducted with a chaperone present.  Constitutional:      General: He is not in acute distress.    Appearance: Normal appearance.     Comments: Extremely thin Caucasian male  HENT:     Head: Normocephalic and atraumatic.     Nose:     Comments: Covered with a mask secondary to COVID-19 precautions    Mouth/Throat:     Comments: Covered with a mask secondary to COVID-19 precautions Eyes:     General: No scleral icterus.       Right eye: No discharge.        Left eye: No discharge.  Neck:     Musculoskeletal: Normal range of motion.     Comments: No thyromegaly or palpable dominant thyroid masses appreciated Cardiovascular:     Rate and Rhythm: Normal rate and regular rhythm.     Pulses: Normal pulses.  Pulmonary:  Effort: Pulmonary effort is normal.     Breath sounds: Normal breath sounds.  Abdominal:     General: Abdomen is flat. Bowel sounds are normal.     Palpations: Abdomen is soft.     Hernia: A hernia is present. Hernia is present in the left inguinal area.  Genitourinary:   Musculoskeletal:        General: No swelling or tenderness.  Lymphadenopathy:     Cervical: No cervical adenopathy.  Skin:    General: Skin is warm and dry.     Comments: Multiple tattoos  Neurological:     General: No focal deficit present.     Mental Status: He is alert.  Psychiatric:        Mood and Affect: Mood normal.        Behavior: Behavior normal.     Data Reviewed There are no relevant data available for review  Assessment This is a 68 year old man with a left inguinal hernia.  It is becoming increasingly symptomatic, and he desires surgical repair.  Plan I have offered him an open inguinal hernia repair. I have explained the procedure, risks, and  aftercare of inguinal hernia repair to Tommy Avila.   Risks include but are not limited to bleeding, infection, wound problems, anesthesia, recurrence, bladder or intestine injury, urinary retention, testicular dysfunction, chronic pain, mesh problems.  He  seems to understand and agrees to proceed.  Questions were answered to his stated satisfaction. We will schedule this at the soonest mutually convenient date.     Tommy Avila 05/02/2019, 5:30 PM

## 2019-05-02 NOTE — H&P (View-Only) (Signed)
Patient ID: Tommy Avila, male   DOB: 10-01-1950, 68 y.o.   MRN: [id number]  Chief Complaint  Patient presents with  . New Patient (Initial Visit)    left inguinal hernia    HPI Tommy Avila is a 68 y.o. male.   He has been referred for surgical evaluation of a left inguinal hernia.  He says that approximately a month ago, he noticed a bulge in his left groin.  He denies having been lifting or straining in any way.  He says that the bulge does not seem to ever go away.  It is occasionally painful.  He denies any constipation.  No nausea or vomiting.  No difficulty with urination.  It is bothering him and he is interested in surgical repair.   Past Medical History:  Diagnosis Date  . COPD (chronic obstructive pulmonary disease) ([place])   . Inguinal hernia    left  . Psoriasis   . Sensory ataxia   . Vertebral artery stenosis, left     Past Surgical History:  Procedure Laterality Date  . BACK SURGERY    . SHOULDER SURGERY Left     Family History  Problem Relation Age of Onset  . Heart attack Mother   . Emphysema Sister   . Prostate cancer Brother     Social History Social History   Tobacco Use  . Smoking status: Current Every Day Smoker    Packs/day: 0.50    Types: Cigarettes  . Smokeless tobacco: Never Used  Substance Use Topics  . Alcohol use: No  . Drug use: Not on file    Allergies  Allergen Reactions  . Oxycodone     Current Outpatient Medications  Medication Sig Dispense Refill  . aspirin EC 81 MG tablet Take by mouth.    . traMADol (ULTRAM) 50 MG tablet Take 1 tablet (50 mg total) by mouth every 6 (six) hours as needed for moderate pain. 12 tablet 0   No current facility-administered medications for this visit.     Review of Systems Review of Systems  All other systems reviewed and are negative.   Blood pressure 121/77, pulse 68, temperature (!) 97.2 F (36.2 C), height 6\' 1"  (1.854 m), weight 151 lb 3.2 oz (68.6 kg), SpO2 96 %. Today's  Vitals   05/02/19 1426  BP: 121/77  Pulse: 68  Temp: (!) 97.2 F (36.2 C)  SpO2: 96%  Weight: 151 lb 3.2 oz (68.6 kg)  Height: 6\' 1"  (1.854 m)   Body mass index is 19.95 kg/m.  Physical Exam Physical Exam Exam conducted with a chaperone present.  Constitutional:      General: He is not in acute distress.    Appearance: Normal appearance.     Comments: Extremely thin Caucasian male  HENT:     Head: Normocephalic and atraumatic.     Nose:     Comments: Covered with a mask secondary to COVID-19 precautions    Mouth/Throat:     Comments: Covered with a mask secondary to COVID-19 precautions Eyes:     General: No scleral icterus.       Right eye: No discharge.        Left eye: No discharge.  Neck:     Musculoskeletal: Normal range of motion.     Comments: No thyromegaly or palpable dominant thyroid masses appreciated Cardiovascular:     Rate and Rhythm: Normal rate and regular rhythm.     Pulses: Normal pulses.  Pulmonary:  Effort: Pulmonary effort is normal.     Breath sounds: Normal breath sounds.  Abdominal:     General: Abdomen is flat. Bowel sounds are normal.     Palpations: Abdomen is soft.     Hernia: A hernia is present. Hernia is present in the left inguinal area.  Genitourinary:   Musculoskeletal:        General: No swelling or tenderness.  Lymphadenopathy:     Cervical: No cervical adenopathy.  Skin:    General: Skin is warm and dry.     Comments: Multiple tattoos  Neurological:     General: No focal deficit present.     Mental Status: He is alert.  Psychiatric:        Mood and Affect: Mood normal.        Behavior: Behavior normal.     Data Reviewed There are no relevant data available for review  Assessment This is a 69 year old man with a left inguinal hernia.  It is becoming increasingly symptomatic, and he desires surgical repair.  Plan I have offered him an open inguinal hernia repair. I have explained the procedure, risks, and  aftercare of inguinal hernia repair to Tommy Avila.   Risks include but are not limited to bleeding, infection, wound problems, anesthesia, recurrence, bladder or intestine injury, urinary retention, testicular dysfunction, chronic pain, mesh problems.  He  seems to understand and agrees to proceed.  Questions were answered to his stated satisfaction. We will schedule this at the soonest mutually convenient date.     Tommy Avila 05/02/2019, 5:30 PM

## 2019-05-06 ENCOUNTER — Other Ambulatory Visit: Payer: Self-pay | Admitting: General Surgery

## 2019-05-06 ENCOUNTER — Telehealth: Payer: Self-pay | Admitting: General Surgery

## 2019-05-06 DIAGNOSIS — K4090 Unilateral inguinal hernia, without obstruction or gangrene, not specified as recurrent: Secondary | ICD-10-CM

## 2019-05-06 NOTE — Telephone Encounter (Signed)
Pt has been advised of pre admission date/time, Covid Testing date and Surgery date.  Surgery Date: 05/22/19 with Dr Cher Nakai left inguinal hernia repair.  Preadmission Testing Date: 05/15/19 between 8-1:00pm-phone interview.  Covid Testing Date: 10/2 between 8-10:30am - patient advised to go to the [place] ([place])  [employer] Video sent via [employer] [facility] and [employer].  Patient has been made aware to call [phone], between 1-3:00pm the Friday before surgery, to find out what time to arrive.

## 2019-05-15 ENCOUNTER — Other Ambulatory Visit: Payer: Self-pay

## 2019-05-15 ENCOUNTER — Encounter
Admission: RE | Admit: 2019-05-15 | Discharge: 2019-05-15 | Disposition: A | Discharge status: Home / Self Care | Payer: Medicare HMO | Source: Ambulatory Visit | Attending: General Surgery | Admitting: General Surgery

## 2019-05-15 NOTE — Patient Instructions (Addendum)
Your procedure is scheduled on: Wednesday 05/22/19  Report to [facility]. To find out your arrival time please call [phone] between 1PM - 3PM on Tuesday 05/21/19.   Remember: Instructions that are not followed completely may result in serious medical risk, up to and including death, or upon the discretion of your surgeon and anesthesiologist your surgery may need to be rescheduled.      _X__ 1. Do not eat food after midnight the night before your procedure.                 No gum chewing or hard candies. You may drink clear liquids up to 2 hours                 before you are scheduled to arrive for your surgery- DO NOT drink clear                 liquids within 2 hours of the start of your surgery.                 Clear Liquids include:  water, apple juice without pulp, clear carbohydrate                 drink such as Clearfast or Gatorade, Black Coffee or Tea (Do not add                 milk or creamer to coffee or tea).   __X__2.  On the morning of surgery brush your teeth with toothpaste and water, you may rinse your mouth with mouthwash if you wish.  Do not swallow any toothpaste or mouthwash.       _X__ 3.  No Alcohol for 24 hours before or after surgery.     _X__ 4.  Do Not Smoke or use e-cigarettes For 24 Hours Prior to Your Surgery.                 Do not use any chewable tobacco products for at least 6 hours prior to                 surgery.     __X__5.  Notify your doctor if there is any change in your medical condition      (cold, fever, infections).      Do not wear jewelry, make-up, hairpins, clips or nail polish. Do not wear lotions, powders, or perfumes.  Do not shave 48 hours prior to surgery. Men may shave face and neck. Do not bring valuables to the hospital.    [facility] is not responsible for any belongings or valuables.    Contacts, dentures/partials or body piercings may not be worn into  surgery. Bring a case for your contacts, glasses or hearing aids, a denture cup will be supplied.    Patients discharged the day of surgery will not be allowed to drive home.     __X__ Take these medicines the morning of surgery with A SIP OF WATER:     1. albuterol (VENTOLIN HFA) 108 (90 Base) MCG/ACT inhaler  2. beclomethasone (QVAR) 80 MCG/ACT inhaler    __X__ Use CHG Soap as directed   _ X___ Use inhalers on the day of surgery. Also bring the inhaler with you to the hospital on the morning of surgery.    __X__ Stop Blood Thinners: Aspirin. Check with your medical doctor about when you need to stop taking your Aspirin before your  procedure.   __X__ Stop Anti-inflammatories 7 days before surgery such as Advil, Ibuprofen, Motrin, BC or Goodies Powder, Naprosyn, Naproxen, Aleve, Aspirin, Meloxicam. May take Tylenol if needed for pain or discomfort.    __X__ Don't start taking any new herbal supplements before your surgery.

## 2019-05-15 NOTE — Progress Notes (Addendum)
Pre-Admit Testing Provider Communication Note  Provider Communiction:  Dr. Ola Spurr  Communication Mode:  Secure Chat  Reason: Patient with no cardiac history. Had shoulder surgery at [facility] July 2020.  EKG January [facility] at [facility]: "NSR, Normal ECG". Is it necessary for the patient to have EKG repeated prior to surgery on 10/7 though he meets age requirements and the EKG is not within the 6 month timeframe?  Response: "I think the EKG from January is sufficient if he's not had any cardiac issues, thanks"    Additional Information: Noted on Pre-Admit Worksheet. No EKG ordered.       Signed: Beulah Gandy, RN

## 2019-05-17 ENCOUNTER — Other Ambulatory Visit: Payer: Self-pay

## 2019-05-17 ENCOUNTER — Other Ambulatory Visit
Admission: RE | Admit: 2019-05-17 | Discharge: 2019-05-17 | Disposition: A | Discharge status: Home / Self Care | Payer: Medicare HMO | Source: Ambulatory Visit | Attending: General Surgery | Admitting: General Surgery

## 2019-05-17 DIAGNOSIS — Z01812 Encounter for preprocedural laboratory examination: Secondary | ICD-10-CM | POA: Diagnosis not present

## 2019-05-17 DIAGNOSIS — Z20828 Contact with and (suspected) exposure to other viral communicable diseases: Secondary | ICD-10-CM | POA: Insufficient documentation

## 2019-05-17 LAB — CBC
HCT: 44.5 % (ref 39.0–52.0)
Hemoglobin: 14.6 g/dL (ref 13.0–17.0)
MCH: 32.1 pg (ref 26.0–34.0)
MCHC: 32.8 g/dL (ref 30.0–36.0)
MCV: 97.8 fL (ref 80.0–100.0)
Platelets: 301 10*3/uL (ref 150–400)
RBC: 4.55 MIL/uL (ref 4.22–5.81)
RDW: 12.9 % (ref 11.5–15.5)
WBC: 8 10*3/uL (ref 4.0–10.5)
nRBC: 0 % (ref 0.0–0.2)

## 2019-05-17 LAB — BASIC METABOLIC PANEL
Anion gap: 9 (ref 5–15)
BUN: 10 mg/dL (ref 8–23)
CO2: 25 mmol/L (ref 22–32)
Calcium: 9 mg/dL (ref 8.9–10.3)
Chloride: 105 mmol/L (ref 98–111)
Creatinine, Ser: 0.91 mg/dL (ref 0.61–1.24)
GFR calc Af Amer: >60 mL/min (ref >60)
GFR calc non Af Amer: >60 mL/min (ref >60)
Glucose, Bld: 126 mg/dL — ABNORMAL HIGH (ref 70–99)
Potassium: 3.3 mmol/L — ABNORMAL LOW (ref 3.5–5.1)
Sodium: 139 mmol/L (ref 135–145)

## 2019-05-17 LAB — SARS CORONAVIRUS 2 (TAT 6-24 HRS): SARS Coronavirus 2: NEGATIVE

## 2019-05-20 ENCOUNTER — Telehealth: Payer: Self-pay | Admitting: General Surgery

## 2019-05-20 MED ORDER — POTASSIUM CHLORIDE CRYS ER 20 MEQ PO TBCR: 20.0000 meq | EXTENDED_RELEASE_TABLET | Freq: Two times a day (BID) | ORAL | 0 refills | Status: DC

## 2019-05-20 NOTE — Telephone Encounter (Addendum)
Patient had labs done during his preadmission testing appointment on 05/17/19 for his upcoming surgery with Dr Celine Ahr on 05/22/19-open left inguinal hernia repair.   Preadmission has faxed a request that the patient will need a K+ supplement and they will recheck the level the day of surgery.   Please send this to the patient's pharmacy and contact  Patient to inform him of this. He will need to pick this up ASAP and start today.

## 2019-05-20 NOTE — Telephone Encounter (Signed)
Patient notified that his Potassium level was low. Per protocol we will send in a prescription for Potassium for him to take twice daily for 5 days. He will pick this up today.

## 2019-05-21 MED ORDER — CEFAZOLIN SODIUM-DEXTROSE 2-4 GM/100ML-% IV SOLN
2.0000 g | INTRAVENOUS | Status: AC
  Administered 2019-05-22: 09:00:00 2 g via INTRAVENOUS

## 2019-05-22 ENCOUNTER — Encounter: Payer: Self-pay | Admitting: *Deleted

## 2019-05-22 ENCOUNTER — Other Ambulatory Visit: Payer: Self-pay

## 2019-05-22 ENCOUNTER — Encounter
Admission: RE | Disposition: A | Discharge status: Home / Self Care | Payer: Self-pay | Source: Home / Self Care | Attending: General Surgery

## 2019-05-22 ENCOUNTER — Ambulatory Visit
Admission: RE | Admit: 2019-05-22 | Discharge: 2019-05-22 | Disposition: A | Discharge status: Home / Self Care | Payer: Medicare HMO | Attending: General Surgery | Admitting: General Surgery

## 2019-05-22 ENCOUNTER — Ambulatory Visit: Payer: Medicare HMO | Admitting: Anesthesiology

## 2019-05-22 DIAGNOSIS — K4090 Unilateral inguinal hernia, without obstruction or gangrene, not specified as recurrent: Secondary | ICD-10-CM

## 2019-05-22 DIAGNOSIS — Z79899 Other long term (current) drug therapy: Secondary | ICD-10-CM | POA: Diagnosis not present

## 2019-05-22 DIAGNOSIS — J449 Chronic obstructive pulmonary disease, unspecified: Secondary | ICD-10-CM | POA: Insufficient documentation

## 2019-05-22 DIAGNOSIS — F17210 Nicotine dependence, cigarettes, uncomplicated: Secondary | ICD-10-CM | POA: Insufficient documentation

## 2019-05-22 DIAGNOSIS — Z7982 Long term (current) use of aspirin: Secondary | ICD-10-CM | POA: Diagnosis not present

## 2019-05-22 DIAGNOSIS — Z885 Allergy status to narcotic agent status: Secondary | ICD-10-CM | POA: Insufficient documentation

## 2019-05-22 HISTORY — PX: INGUINAL HERNIA REPAIR: SHX194

## 2019-05-22 LAB — POCT I-STAT, CHEM 8
BUN: 13 mg/dL (ref 8–23)
Calcium, Ion: 1.26 mmol/L (ref 1.15–1.40)
Chloride: 106 mmol/L (ref 98–111)
Creatinine, Ser: 0.9 mg/dL (ref 0.61–1.24)
Glucose, Bld: 74 mg/dL (ref 70–99)
HCT: 44 % (ref 39.0–52.0)
Hemoglobin: 15 g/dL (ref 13.0–17.0)
Potassium: 3.7 mmol/L (ref 3.5–5.1)
Sodium: 141 mmol/L (ref 135–145)
TCO2: 23 mmol/L (ref 22–32)

## 2019-05-22 SURGERY — REPAIR, HERNIA, INGUINAL, ADULT
Anesthesia: General | Site: Inguinal | Laterality: Left

## 2019-05-22 MED ORDER — PROPOFOL 10 MG/ML IV BOLUS
INTRAVENOUS | Status: AC
  Filled 2019-05-22: qty 20

## 2019-05-22 MED ORDER — GABAPENTIN 300 MG PO CAPS
ORAL_CAPSULE | ORAL | Status: AC
  Filled 2019-05-22: qty 1

## 2019-05-22 MED ORDER — ROCURONIUM BROMIDE 100 MG/10ML IV SOLN
INTRAVENOUS | Status: DC | PRN
  Administered 2019-05-22: 50 mg via INTRAVENOUS

## 2019-05-22 MED ORDER — BUPIVACAINE HCL (PF) 0.25 % IJ SOLN
INTRAMUSCULAR | Status: AC
  Filled 2019-05-22: qty 30

## 2019-05-22 MED ORDER — EPHEDRINE SULFATE 50 MG/ML IJ SOLN
INTRAMUSCULAR | Status: AC
  Filled 2019-05-22: qty 1

## 2019-05-22 MED ORDER — EPHEDRINE SULFATE 50 MG/ML IJ SOLN
INTRAMUSCULAR | Status: DC | PRN
  Administered 2019-05-22: 10 mg via INTRAVENOUS

## 2019-05-22 MED ORDER — ONDANSETRON HCL 4 MG/2ML IJ SOLN
INTRAMUSCULAR | Status: AC
  Filled 2019-05-22: qty 2

## 2019-05-22 MED ORDER — BUPIVACAINE LIPOSOME 1.3 % IJ SUSP
INTRAMUSCULAR | Status: AC
  Filled 2019-05-22: qty 20

## 2019-05-22 MED ORDER — TRAMADOL HCL 50 MG PO TABS: 50.0000 mg | ORAL_TABLET | Freq: Four times a day (QID) | ORAL | 0 refills | Status: AC | PRN

## 2019-05-22 MED ORDER — ONDANSETRON HCL 4 MG/2ML IJ SOLN
INTRAMUSCULAR | Status: DC | PRN
  Administered 2019-05-22: 4 mg via INTRAVENOUS

## 2019-05-22 MED ORDER — ACETAMINOPHEN 500 MG PO TABS
1000.0000 mg | ORAL_TABLET | ORAL | Status: AC
  Administered 2019-05-22: 1000 mg via ORAL

## 2019-05-22 MED ORDER — MEPERIDINE HCL 50 MG/ML IJ SOLN: 6.2500 mg | INTRAMUSCULAR | Status: DC | PRN

## 2019-05-22 MED ORDER — LIDOCAINE-EPINEPHRINE 1 %-1:100000 IJ SOLN
INTRAMUSCULAR | Status: AC
  Filled 2019-05-22: qty 1

## 2019-05-22 MED ORDER — DEXAMETHASONE SODIUM PHOSPHATE 10 MG/ML IJ SOLN
INTRAMUSCULAR | Status: DC | PRN
  Administered 2019-05-22: 8 mg via INTRAVENOUS

## 2019-05-22 MED ORDER — FAMOTIDINE 20 MG PO TABS
ORAL_TABLET | ORAL | Status: AC
  Filled 2019-05-22: qty 1

## 2019-05-22 MED ORDER — LACTATED RINGERS IV SOLN
INTRAVENOUS | Status: DC
  Administered 2019-05-22: 08:00:00 via INTRAVENOUS

## 2019-05-22 MED ORDER — FENTANYL CITRATE (PF) 250 MCG/5ML IJ SOLN
INTRAMUSCULAR | Status: AC
  Filled 2019-05-22: qty 5

## 2019-05-22 MED ORDER — LIDOCAINE HCL (PF) 2 % IJ SOLN
INTRAMUSCULAR | Status: AC
  Filled 2019-05-22: qty 10

## 2019-05-22 MED ORDER — DEXAMETHASONE SODIUM PHOSPHATE 10 MG/ML IJ SOLN
INTRAMUSCULAR | Status: AC
  Filled 2019-05-22: qty 1

## 2019-05-22 MED ORDER — FENTANYL CITRATE (PF) 100 MCG/2ML IJ SOLN
25.0000 ug | INTRAMUSCULAR | Status: DC | PRN
  Administered 2019-05-22: 11:00:00 50 ug via INTRAVENOUS

## 2019-05-22 MED ORDER — GABAPENTIN 300 MG PO CAPS
300.0000 mg | ORAL_CAPSULE | ORAL | Status: AC
  Administered 2019-05-22: 07:00:00 300 mg via ORAL

## 2019-05-22 MED ORDER — LIDOCAINE HCL (CARDIAC) PF 100 MG/5ML IV SOSY
PREFILLED_SYRINGE | INTRAVENOUS | Status: DC | PRN
  Administered 2019-05-22: 80 mg via INTRAVENOUS

## 2019-05-22 MED ORDER — CELECOXIB 200 MG PO CAPS
ORAL_CAPSULE | ORAL | Status: AC
  Filled 2019-05-22: qty 1

## 2019-05-22 MED ORDER — PHENYLEPHRINE HCL (PRESSORS) 10 MG/ML IV SOLN
INTRAVENOUS | Status: AC
  Filled 2019-05-22: qty 1

## 2019-05-22 MED ORDER — CHLORHEXIDINE GLUCONATE CLOTH 2 % EX PADS: 6.0000 | MEDICATED_PAD | Freq: Once | CUTANEOUS | Status: DC

## 2019-05-22 MED ORDER — BUPIVACAINE LIPOSOME 1.3 % IJ SUSP: 20.0000 mL | Freq: Once | INTRAMUSCULAR | Status: DC

## 2019-05-22 MED ORDER — IBUPROFEN 800 MG PO TABS: 800.0000 mg | ORAL_TABLET | Freq: Three times a day (TID) | ORAL | 0 refills | Status: DC | PRN

## 2019-05-22 MED ORDER — SUGAMMADEX SODIUM 200 MG/2ML IV SOLN
INTRAVENOUS | Status: AC
  Filled 2019-05-22: qty 2

## 2019-05-22 MED ORDER — BUPIVACAINE HCL (PF) 0.25 % IJ SOLN
INTRAMUSCULAR | Status: DC | PRN
  Administered 2019-05-22: 5 mL

## 2019-05-22 MED ORDER — FENTANYL CITRATE (PF) 100 MCG/2ML IJ SOLN
INTRAMUSCULAR | Status: AC
  Filled 2019-05-22: qty 2

## 2019-05-22 MED ORDER — SUGAMMADEX SODIUM 200 MG/2ML IV SOLN
INTRAVENOUS | Status: DC | PRN
  Administered 2019-05-22: 150 mg via INTRAVENOUS

## 2019-05-22 MED ORDER — PROPOFOL 10 MG/ML IV BOLUS
INTRAVENOUS | Status: DC | PRN
  Administered 2019-05-22: 50 mg via INTRAVENOUS
  Administered 2019-05-22: 140 mg via INTRAVENOUS

## 2019-05-22 MED ORDER — KETOROLAC TROMETHAMINE 30 MG/ML IJ SOLN
INTRAMUSCULAR | Status: DC | PRN
  Administered 2019-05-22: 30 mg via INTRAVENOUS

## 2019-05-22 MED ORDER — LIDOCAINE-EPINEPHRINE 1 %-1:100000 IJ SOLN
INTRAMUSCULAR | Status: DC | PRN
  Administered 2019-05-22: 5 mL

## 2019-05-22 MED ORDER — CELECOXIB 200 MG PO CAPS
200.0000 mg | ORAL_CAPSULE | ORAL | Status: AC
  Administered 2019-05-22: 200 mg via ORAL

## 2019-05-22 MED ORDER — BUPIVACAINE LIPOSOME 1.3 % IJ SUSP
INTRAMUSCULAR | Status: DC | PRN
  Administered 2019-05-22: 20 mL

## 2019-05-22 MED ORDER — FAMOTIDINE 20 MG PO TABS
20.0000 mg | ORAL_TABLET | Freq: Once | ORAL | Status: AC
  Administered 2019-05-22: 07:00:00 20 mg via ORAL

## 2019-05-22 MED ORDER — ACETAMINOPHEN 500 MG PO TABS
ORAL_TABLET | ORAL | Status: AC
  Filled 2019-05-22: qty 2

## 2019-05-22 MED ORDER — CEFAZOLIN SODIUM-DEXTROSE 2-4 GM/100ML-% IV SOLN
INTRAVENOUS | Status: AC
  Filled 2019-05-22: qty 100

## 2019-05-22 MED ORDER — FENTANYL CITRATE (PF) 100 MCG/2ML IJ SOLN
INTRAMUSCULAR | Status: DC | PRN
  Administered 2019-05-22 (×5): 50 ug via INTRAVENOUS

## 2019-05-22 MED ORDER — CHLORHEXIDINE GLUCONATE CLOTH 2 % EX PADS
6.0000 | MEDICATED_PAD | Freq: Once | CUTANEOUS | Status: AC
  Administered 2019-05-22: 08:00:00 6 via TOPICAL

## 2019-05-22 MED ORDER — ROCURONIUM BROMIDE 50 MG/5ML IV SOLN
INTRAVENOUS | Status: AC
  Filled 2019-05-22: qty 1

## 2019-05-22 MED ORDER — SODIUM CHLORIDE (PF) 0.9 % IJ SOLN
INTRAMUSCULAR | Status: AC
  Filled 2019-05-22: qty 10

## 2019-05-22 MED ORDER — PROMETHAZINE HCL 25 MG/ML IJ SOLN: 6.2500 mg | INTRAMUSCULAR | Status: DC | PRN

## 2019-05-22 SURGICAL SUPPLY — 35 items
CANISTER SUCT 1200ML W/VALVE (MISCELLANEOUS) ×2 IMPLANT
CHLORAPREP W/TINT 26 (MISCELLANEOUS) ×2 IMPLANT
COVER WAND RF STERILE (DRAPES) ×2 IMPLANT
DERMABOND ADVANCED (GAUZE/BANDAGES/DRESSINGS) ×1
DERMABOND ADVANCED .7 DNX12 (GAUZE/BANDAGES/DRESSINGS) ×1 IMPLANT
DRAIN PENROSE 5/8X18 LTX STRL (DRAIN) ×2 IMPLANT
DRAPE LAPAROTOMY 77X122 PED (DRAPES) ×2 IMPLANT
ELECT CAUTERY BLADE TIP 2.5 (TIP) ×2
ELECT REM PT RETURN 9FT ADLT (ELECTROSURGICAL) ×2
ELECTRODE CAUTERY BLDE TIP 2.5 (TIP) ×1 IMPLANT
ELECTRODE REM PT RTRN 9FT ADLT (ELECTROSURGICAL) ×1 IMPLANT
GLOVE BIO SURGEON STRL SZ 6.5 (GLOVE) ×6 IMPLANT
GLOVE INDICATOR 7.0 STRL GRN (GLOVE) ×6 IMPLANT
GOWN STRL REUS W/ TWL LRG LVL3 (GOWN DISPOSABLE) ×2 IMPLANT
GOWN STRL REUS W/TWL LRG LVL3 (GOWN DISPOSABLE) ×2
KIT TURNOVER KIT A (KITS) ×2 IMPLANT
LABEL OR SOLS (LABEL) ×2 IMPLANT
MESH PARIETEX PROGRIP LEFT (Mesh General) ×2 IMPLANT
NEEDLE HYPO 22GX1.5 SAFETY (NEEDLE) ×2 IMPLANT
NS IRRIG 500ML POUR BTL (IV SOLUTION) ×2 IMPLANT
PACK BASIN MINOR ARMC (MISCELLANEOUS) ×2 IMPLANT
SPONGE KITTNER 5P (MISCELLANEOUS) ×2 IMPLANT
SPONGE LAP 18X18 RF (DISPOSABLE) ×2 IMPLANT
STRIP CLOSURE SKIN 1/2X4 (GAUZE/BANDAGES/DRESSINGS) ×2 IMPLANT
SUT ETHIBOND NAB MO 7 #0 18IN (SUTURE) ×2 IMPLANT
SUT MNCRL 4-0 (SUTURE) ×1
SUT MNCRL 4-0 27XMFL (SUTURE) ×1
SUT PROLENE 2 0 SH DA (SUTURE) IMPLANT
SUT VIC AB 2-0 SH 27 (SUTURE)
SUT VIC AB 2-0 SH 27XBRD (SUTURE) IMPLANT
SUT VIC AB 3-0 SH 27 (SUTURE) ×1
SUT VIC AB 3-0 SH 27X BRD (SUTURE) ×1 IMPLANT
SUTURE MNCRL 4-0 27XMF (SUTURE) ×1 IMPLANT
SYR 10ML LL (SYRINGE) ×2 IMPLANT
SYR BULB IRRIG 60ML STRL (SYRINGE) ×2 IMPLANT

## 2019-05-22 NOTE — Transfer of Care (Signed)
Immediate Anesthesia Transfer of Care Note  Patient: Tommy Avila  Procedure(s) Performed: HERNIA REPAIR INGUINAL ADULT (Left Inguinal)  Patient Location: PACU  Anesthesia Type:General  Level of Consciousness: drowsy  Airway & Oxygen Therapy: Patient Spontanous Breathing and Patient connected to face mask oxygen  Post-op Assessment: Report given to RN and Post -op Vital signs reviewed and stable  Post vital signs: Reviewed and stable  Last Vitals:  Vitals Value Taken Time  BP 110/67 05/22/19 0954  Temp 36.3 C 05/22/19 0954  Pulse 65 05/22/19 0957  Resp 13 05/22/19 0957  SpO2 100 % 05/22/19 0957  Vitals shown include unvalidated device data.  Last Pain:  Vitals:   05/22/19 0954  TempSrc:   PainSc: Asleep         Complications: No apparent anesthesia complications

## 2019-05-22 NOTE — Anesthesia Preprocedure Evaluation (Signed)
Anesthesia Evaluation  Patient identified by MRN, date of birth, ID band Patient awake    Reviewed: Allergy & Precautions, NPO status , Patient's Chart, lab work & pertinent test results  History of Anesthesia Complications Negative for: history of anesthetic complications  Airway Mallampati: II  TM Distance: >3 FB Neck ROM: Full    Dental  (+) Edentulous Upper, Poor Dentition, Missing   Pulmonary neg sleep apnea, COPD,  COPD inhaler, Current Smoker and Patient abstained from smoking.,    breath sounds clear to auscultation- rhonchi (-) wheezing      Cardiovascular Exercise Tolerance: Good (-) hypertension(-) CAD, (-) Past MI, (-) Cardiac Stents and (-) CABG  Rhythm:Regular Rate:Normal - Systolic murmurs and - Diastolic murmurs    Neuro/Psych neg Seizures negative neurological ROS  negative psych ROS   GI/Hepatic negative GI ROS, Neg liver ROS,   Endo/Other  negative endocrine ROSneg diabetes  Renal/GU negative Renal ROS     Musculoskeletal negative musculoskeletal ROS (+)   Abdominal (+) - obese,   Peds  Hematology negative hematology ROS (+)   Anesthesia Other Findings Past Medical History: No date: COPD (chronic obstructive pulmonary disease) (HCC) 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, left   Reproductive/Obstetrics                             Anesthesia Physical Anesthesia Plan  ASA: II  Anesthesia Plan: General   Post-op Pain Management:    Induction: Intravenous  PONV Risk Score and Plan: 0 and Ondansetron  Airway Management Planned: Oral ETT  Additional Equipment:   Intra-op Plan:   Post-operative Plan: Extubation in OR  Informed Consent: I have reviewed the patients History and Physical, chart, labs and discussed the procedure including the risks, benefits and alternatives for  the proposed anesthesia with the patient or authorized representative who has indicated his/her understanding and acceptance.     Dental advisory given  Plan Discussed with: CRNA and Anesthesiologist  Anesthesia Plan Comments:         Anesthesia Quick Evaluation

## 2019-05-22 NOTE — Interval H&P Note (Signed)
History and Physical Interval Note:  05/22/2019 8:14 AM  Tommy Avila  has presented today for surgery, with the diagnosis of Left inguinal hernia.  The various methods of treatment have been discussed with the patient and family. After consideration of risks, benefits and other options for treatment, the patient has consented to  Procedure(s): HERNIA REPAIR INGUINAL ADULT (Left) as a surgical intervention.  The patient's history has been reviewed, patient examined, no change in status, stable for surgery.  I have reviewed the patient's chart and labs.  Questions were answered to the patient's satisfaction.     Fredirick Maudlin

## 2019-05-22 NOTE — Anesthesia Procedure Notes (Signed)
Procedure Name: Intubation Date/Time: 05/22/2019 8:37 AM Performed by: Esaw Grandchild, CRNA Pre-anesthesia Checklist: Patient identified, Emergency Drugs available, Suction available and Patient being monitored Patient Re-evaluated:Patient Re-evaluated prior to induction Oxygen Delivery Method: Circle system utilized Preoxygenation: Pre-oxygenation with 100% oxygen Induction Type: IV induction Ventilation: Mask ventilation without difficulty Laryngoscope Size: Miller and 2 Grade View: Grade I Tube type: Oral Tube size: 8.0 mm Number of attempts: 1 Airway Equipment and Method: Stylet and Oral airway Placement Confirmation: ETT inserted through vocal cords under direct vision,  positive ETCO2 and breath sounds checked- equal and bilateral Secured at: 24 cm Tube secured with: Tape Dental Injury: Teeth and Oropharynx as per pre-operative assessment

## 2019-05-22 NOTE — Op Note (Signed)
Hernia, Open, Procedure Note  Indications: The patient presented with a history of a left, reducible inguinal hernia.    Pre-operative Diagnosis: left reducible inguinal hernia  Post-operative Diagnosis: same  Surgeon: Fredirick Maudlin   Assistants: Arvilla Meres, RNFA  Anesthesia: General endotracheal anesthesia  ASA Class: 2  Procedure Details  The patient was seen again in the Holding Room. The risks, benefits, complications, treatment options, and expected outcomes were discussed with the patient. The possibilities of reaction to medication, pulmonary aspiration, perforation of viscus, bleeding, recurrent infection, the need for additional procedures, and development of a complication requiring transfusion or further operation were discussed with the patient and/or family. There was concurrence with the proposed plan, and informed consent was obtained. The site of surgery was properly noted/marked. The patient was taken to the Operating Room, identified as Tommy Avila, and the procedure verified as hernia repair. A Time Out was held and the above information confirmed.  The patient was placed in the supine position and underwent induction of anesthesia, the lower abdomen and groin was prepped and draped in the standard fashion, and a 1:1 mixture of 0.25% bupivacaine and 1% lidocaine with epinephrine was used to anesthetize the skin over the mid-portion of the inguinal canal. A transverse incision was made. Dissection was carried through the soft tissue to expose the inguinal canal and inguinal ligament along its lower edge. The external oblique fascia was split along the course of its fibers, exposing the inguinal canal. The cord and nerve were looped using a Penrose drain and reflected out of the field.  The hernia sac was identified and dissected free from the cord structures.  It was opened and then ligated with a Vicryl pursestring stitch.  It was reduced into the abdominal cavity  through the defect.  A small cord lipoma was resected.  A left-sided ProGrip mesh prosthesis was selected.  It was secured to the pubic tubercle with 0 Ethibond suture.  It was then carefully laid into the floor of the inguinal canal.  The cord and nerve were brought through the opening and the Velcro secured to itself.  The opening was just large enough to admit the tip of my small finger.  The mesh was split to allow passage of the cord and nerve into the canal without entrapment. The contents were then returned to canal and the external oblique fashion was then closed in a continuous fashion using 2-0 Vicryl suture taking care not to cause entrapment.  Exparel was injected along the fascial planes.  Deep 3-0 Vicryl sutures were used to reapproximate the incision and the skin was closed with running subcuticular Monocryl.  The skin was cleaned and Dermabond and Steri-Strips were applied.  The patient was awakened, extubated, and taken to the postanesthesia care unit in good condition.  Instrument, sponge, and needle counts were correct prior to closure and at the conclusion of the case.  Findings: Hernia as above  Estimated Blood Loss: Minimal         Drains: None         Total IV Fluids: See anesthesia record         Specimens: None               Complications: None; patient tolerated the procedure well.         Disposition: PACU - hemodynamically stable.         Condition: stable

## 2019-05-22 NOTE — Discharge Instructions (Signed)

## 2019-05-22 NOTE — Anesthesia Post-op Follow-up Note (Signed)
Anesthesia QCDR form completed.        

## 2019-05-22 NOTE — Anesthesia Postprocedure Evaluation (Signed)
Anesthesia Post Note  Patient: Tommy Avila  Procedure(s) Performed: HERNIA REPAIR INGUINAL ADULT (Left Inguinal)  Patient location during evaluation: PACU Anesthesia Type: General Level of consciousness: awake and alert and oriented Pain management: pain level controlled Vital Signs Assessment: post-procedure vital signs reviewed and stable Respiratory status: spontaneous breathing, nonlabored ventilation and respiratory function stable Cardiovascular status: blood pressure returned to baseline and stable Postop Assessment: no signs of nausea or vomiting Anesthetic complications: no     Last Vitals:  Vitals:   05/22/19 1046 05/22/19 1105  BP: 122/84 117/79  Pulse: 71 66  Resp: 18 18  Temp: (!) 36.2 C   SpO2: 98% 99%    Last Pain:  Vitals:   05/22/19 1105  TempSrc:   PainSc: 0-No pain                 Victoriah Wilds

## 2019-05-23 ENCOUNTER — Ambulatory Visit (INDEPENDENT_AMBULATORY_CARE_PROVIDER_SITE_OTHER): Payer: Medicare HMO | Admitting: General Surgery

## 2019-05-23 ENCOUNTER — Encounter: Payer: Self-pay | Admitting: General Surgery

## 2019-05-23 ENCOUNTER — Other Ambulatory Visit: Payer: Self-pay

## 2019-05-23 VITALS — BP 136/72 | HR 82 | Temp 97.9°F | Resp 14 | Ht 73.0 in | Wt 151.0 lb

## 2019-05-23 DIAGNOSIS — Z8719 Personal history of other diseases of the digestive system: Secondary | ICD-10-CM

## 2019-05-23 DIAGNOSIS — Z9889 Other specified postprocedural states: Secondary | ICD-10-CM

## 2019-05-23 NOTE — Progress Notes (Signed)
Tommy Avila showed up in the office today as a walk-in.  He underwent an uncomplicated left inguinal hernia repair yesterday he was concerned because he thought the wound had come open.  He wanted to have it checked.  He has otherwise been doing well, with good pain control.  Today's Vitals   05/23/19 0915  BP: 136/72  Pulse: 82  Resp: 14  Temp: 97.9 F (36.6 C)  TempSrc: Temporal  SpO2: 98%  Weight: 151 lb (68.5 kg)  Height: 6\' 1"  (1.854 m)  PainSc: 0-No pain   Body mass index is 19.92 kg/m. Focused examination of his incision: There is a slight skin separation at the lateral edge.  There is a small amount of blood present.  The Steri-Strips were starting to lift from this area.  I removed the Steri-Strips.  The remainder of the wound is well approximated.  I reapplied Dermabond and Steri-Strips.  He will follow-up as scheduled on October 27.

## 2019-05-23 NOTE — Patient Instructions (Addendum)
Please see your follow up appointment listed below.  °

## 2019-06-05 ENCOUNTER — Telehealth: Payer: Self-pay | Admitting: General Surgery

## 2019-06-05 NOTE — Telephone Encounter (Signed)
Spoke with patient. Bright redness around incision. No drainage. The pain has increased since his post op visit. Patient states there is a small pimple like bump with pus in it. Denies fever. Patient added to schedule 06/06/19.

## 2019-06-05 NOTE — Telephone Encounter (Signed)
Telephone Triage Questions    Type of surgery?     HERNIA REPAIR INGUINAL ADULT                 Date?                05/22/2019              Physician?     DR. Cannon    Pain ? A little Where and how severe, (1-10)  Between 4 and 5   Nausea, vomiting, Fever, chills? Chills    Any Bright redness around the wound/ area? Redness said looks infected    Please call patient advise

## 2019-06-06 ENCOUNTER — Encounter: Payer: Self-pay | Admitting: General Surgery

## 2019-06-11 ENCOUNTER — Other Ambulatory Visit: Payer: Self-pay

## 2019-06-11 ENCOUNTER — Ambulatory Visit (INDEPENDENT_AMBULATORY_CARE_PROVIDER_SITE_OTHER): Payer: Self-pay | Admitting: General Surgery

## 2019-06-11 ENCOUNTER — Encounter: Payer: Self-pay | Admitting: General Surgery

## 2019-06-11 VITALS — BP 120/71 | HR 71 | Temp 97.3°F | Ht 73.0 in | Wt 149.8 lb

## 2019-06-11 DIAGNOSIS — Z9889 Other specified postprocedural states: Secondary | ICD-10-CM

## 2019-06-11 DIAGNOSIS — Z8719 Personal history of other diseases of the digestive system: Secondary | ICD-10-CM | POA: Insufficient documentation

## 2019-06-11 NOTE — Progress Notes (Signed)
Tommy Avila is here today for follow-up of a left inguinal hernia repair.  He reports that he does have some pain at the incision site, where he thinks a stitch may be poking through.  Otherwise, his pain has been well controlled.  He denies any nausea or vomiting.  No fevers or chills.  He is eating normally and having regular bowel movements.  Urination is normal.  Today's Vitals   06/11/19 0927  BP: 120/71  Pulse: 71  Temp: (!) 97.3 F (36.3 C)  TempSrc: Temporal  SpO2: 95%  Weight: 149 lb 12.8 oz (67.9 kg)  Height: 6\' 1"  (1.854 m)  PainSc: 3   PainLoc: Abdomen   Body mass index is 19.76 kg/m. Focused examination of the surgical site demonstrates that there is a tail end of suture material poking through the medial aspect of the wound.  There is some surrounding erythema, but no purulent drainage identified.  The remainder of the incision is healing nicely.  There is a slight healing ridge beneath the surface.  Impression and plan: This is a 68 year old man who underwent an uncomplicated open left inguinal hernia repair.  He appears to be spitting a suture at the medial aspect.  This was trimmed and he noted almost immediate relief.  I recommended that he apply warm compresses to the area.  He may massage the incision with vitamin E oil, if desired, to minimize scarring.  He should continue to refrain from lifting anything heavier than 10 pounds for a total of 6 weeks from the time of surgery. He may resume all of his other activities, without exception.  We will see him on an as-needed basis.

## 2019-06-11 NOTE — Patient Instructions (Signed)
Patient may apply warm compress on area to help with pain and discomfort. Patient may also try Vitamin E oil to massage to help with moisturizing the skin. Please refrain from any heavy lifting for six weeks.    Laparoscopic Inguinal Hernia Repair, Adult, Care After This sheet gives you information about how to care for yourself after your procedure. Your health care provider may also give you more specific instructions. If you have problems or questions, contact your health care provider. What can I expect after the procedure? After the procedure, it is common to have:  Pain.  Swelling and bruising around the incision area.  Scrotal swelling, in men.  Some fluid or blood draining from your incisions. Follow these instructions at home: Incision care  Follow instructions from your health care provider about how to take care of your incisions. Make sure you: ? Wash your hands with soap and water before you change your bandage (dressing). If soap and water are not available, use hand sanitizer. ? Change your dressing as told by your health care provider. ? Leave stitches (sutures), skin glue, or adhesive strips in place. These skin closures may need to stay in place for 2 weeks or longer. If adhesive strip edges start to loosen and curl up, you may trim the loose edges. Do not remove adhesive strips completely unless your health care provider tells you to do that.  Check your incision area every day for signs of infection. Check for: ? More redness, swelling, or pain. ? More fluid or blood. ? Warmth. ? Pus or a bad smell.  Wear loose, soft clothing while your incisions heal. Driving  Do not drive or use heavy machinery while taking prescription pain medicine.  Do not drive for 24 hours if you were given a medicine to help you relax (sedative) during your procedure. Activity  Do not lift anything that is heavier than 10 lb (4.5 kg), or the limit that you are told, until your health  care provider says that it is safe.  Ask your health care provider what activities are safe for you.A lot of activity during the first week after surgery can increase pain and swelling. For 1 week after your procedure: ? Avoid activities that take a lot of effort, such as exercise or sports. ? You may walk and climb stairs as needed for daily activity, but avoid long walks or climbing stairs for exercise. Managing pain and swelling   Put ice on painful or swollen areas: ? Put ice in a plastic bag. ? Place a towel between your skin and the bag. ? Leave the ice on for 20 minutes, 2-3 times a day. General instructions  Do not take baths, swim, or use a hot tub until your health care provider approves. Ask your health care provider if you may take showers. You may only be allowed to take sponge baths.  Take over-the-counter and prescription medicines only as told by your health care provider.  To prevent or treat constipation while you are taking prescription pain medicine, your health care provider may recommend that you: ? Drink enough fluid to keep your urine pale yellow. ? Take over-the-counter or prescription medicines. ? Eat foods that are high in fiber, such as fresh fruits and vegetables, whole grains, and beans. ? Limit foods that are high in fat and processed sugars, such as fried and sweet foods.  Do not use any products that contain nicotine or tobacco, such as cigarettes and e-cigarettes. If you need  help quitting, ask your health care provider.  Drink enough fluid to keep your urine pale yellow.  Keep all follow-up visits as told by your health care provider. This is important. Contact a health care provider if:  You have more redness, swelling, or pain around your incisions or your groin area.  You have more swelling in your scrotum.  You have more fluid or blood coming from your incisions.  Your incisions feel warm to the touch.  You have severe pain and medicines  do not help.  You have abdominal pain or swelling.  You cannot eat or drink without vomiting.  You cannot urinate or pass a bowel movement.  You faint.  You feel dizzy.  You have nausea and vomiting.  You have a fever. Get help right away if:  You have pus or a bad smell coming from your incisions.  You have chest pain.  You have problems breathing. Summary  Pain, swelling, and bruising are common after the procedure.  Check your incision area every day for signs of infection, such as more redness, swelling, or pain.  Put ice on painful or swollen areas for 20 minutes, 2-3 times a day. This information is not intended to replace advice given to you by your health care provider. Make sure you discuss any questions you have with your health care provider. Document Released: 11/10/2016 Document Revised: 08/04/2017 Document Reviewed: 11/10/2016 Elsevier Patient Education  2020 [employer].

## 2020-05-12 IMAGING — DX DG SHOULDER 2+V*L*
3 series · 3 of 3 positions shown · non-contrast
Comparison: None

CLINICAL DATA: LEFT shoulder pain for 3 weeks worse with movement

EXAM:
LEFT SHOULDER - 2+ VIEW

[shoulder obl (1 of 2)]
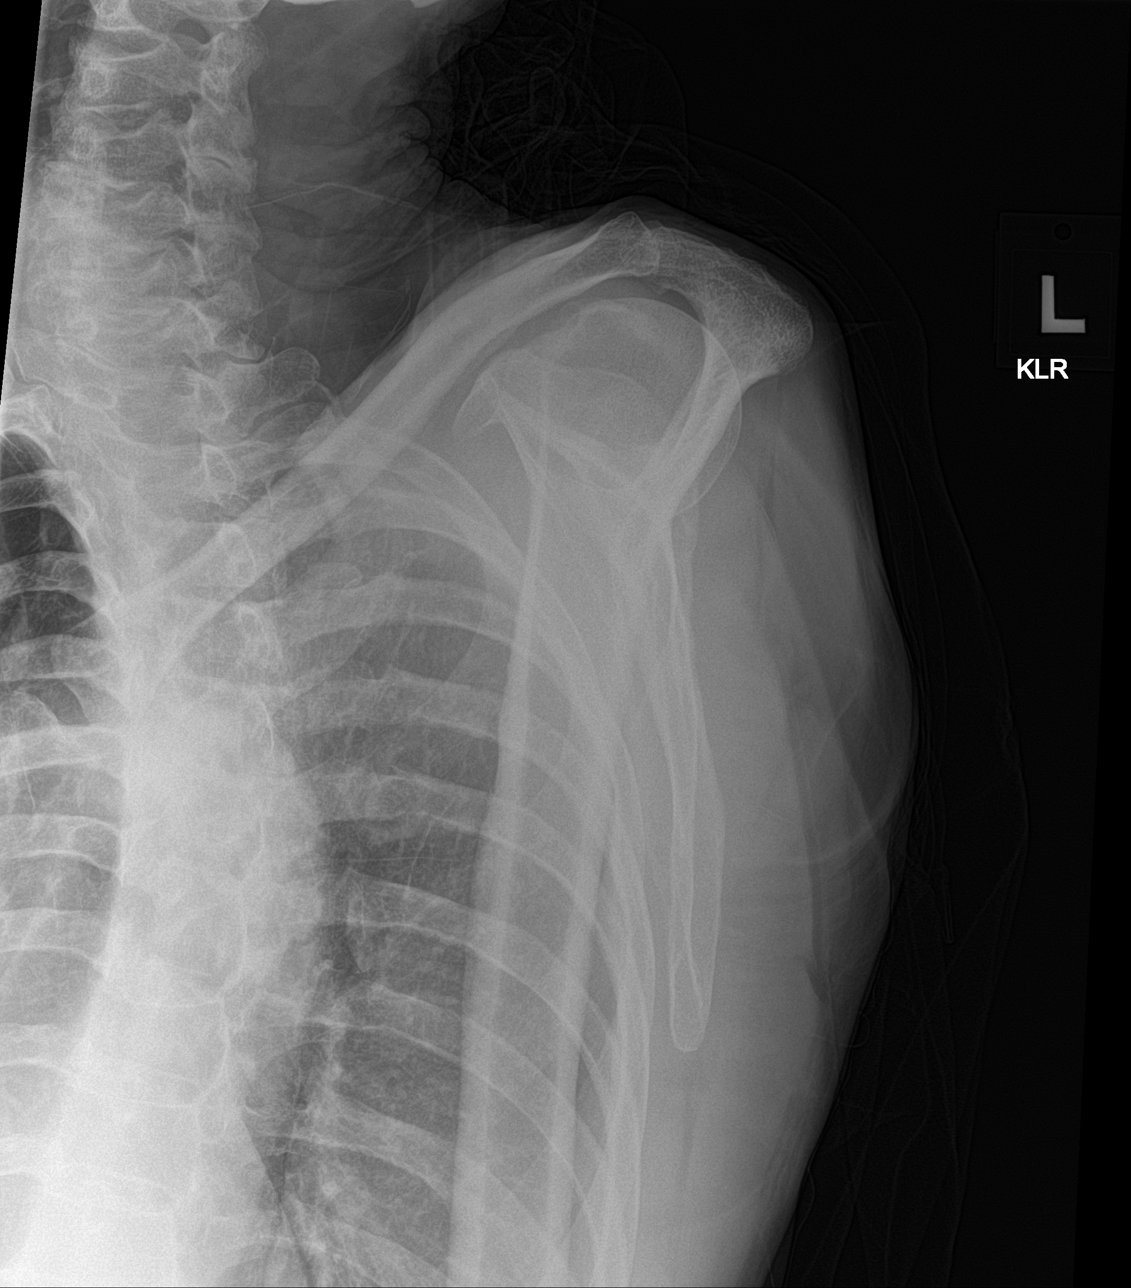

[shoulder ap]
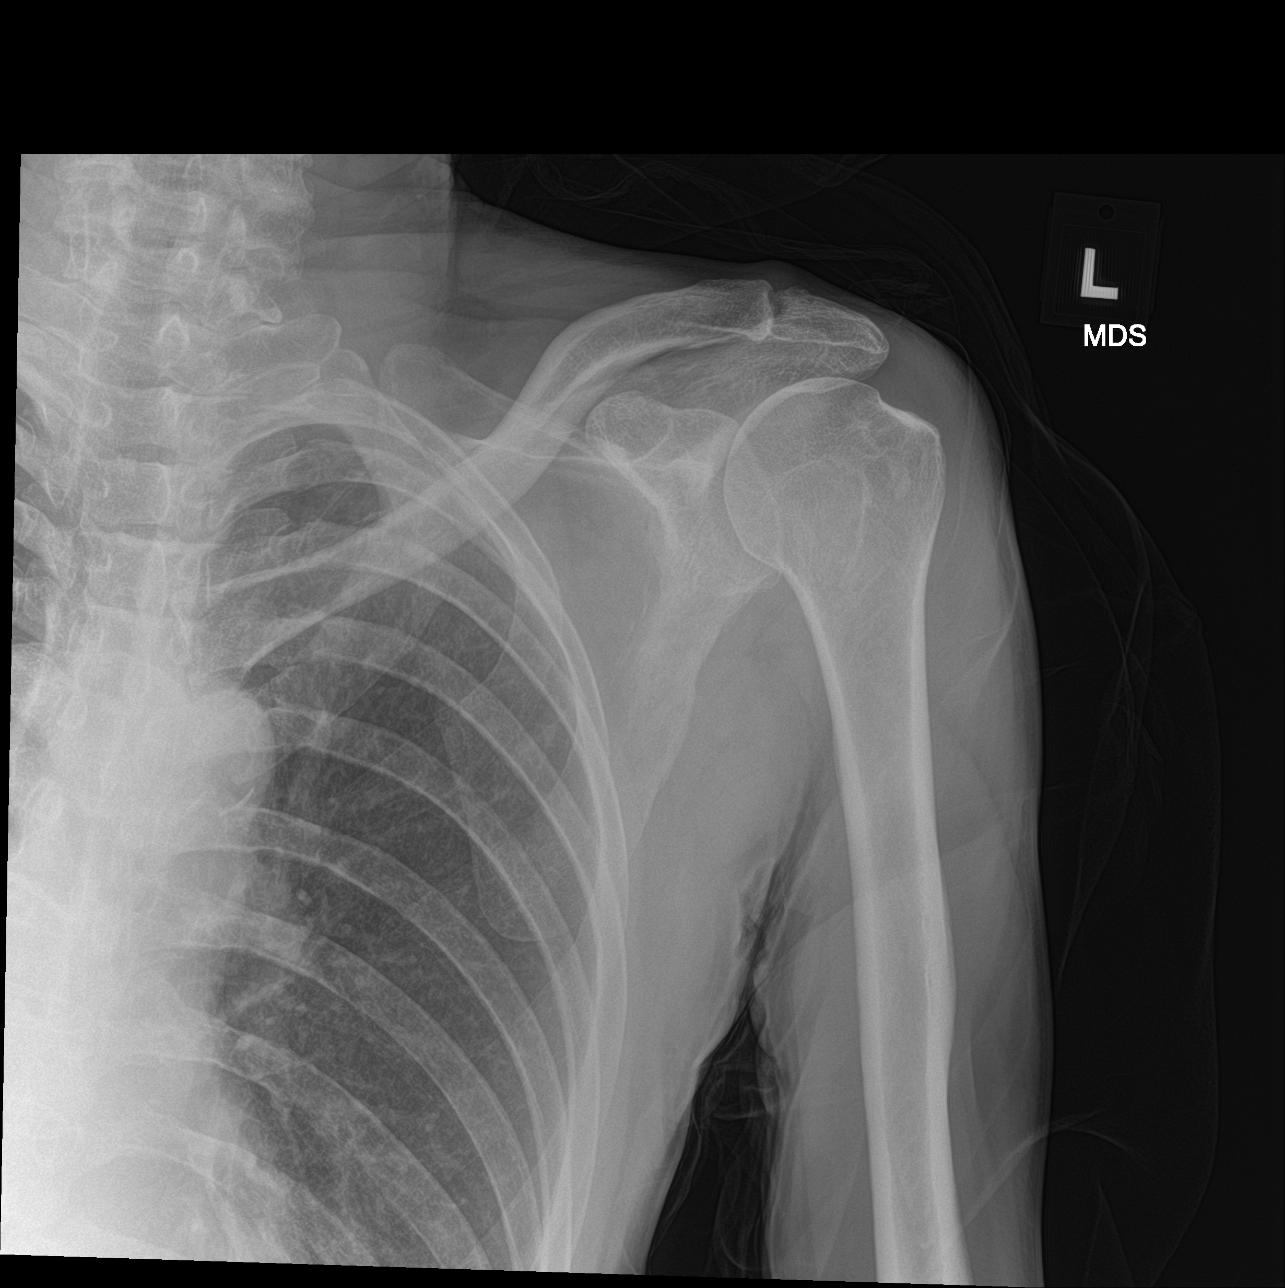

[shoulder obl (2 of 2)]
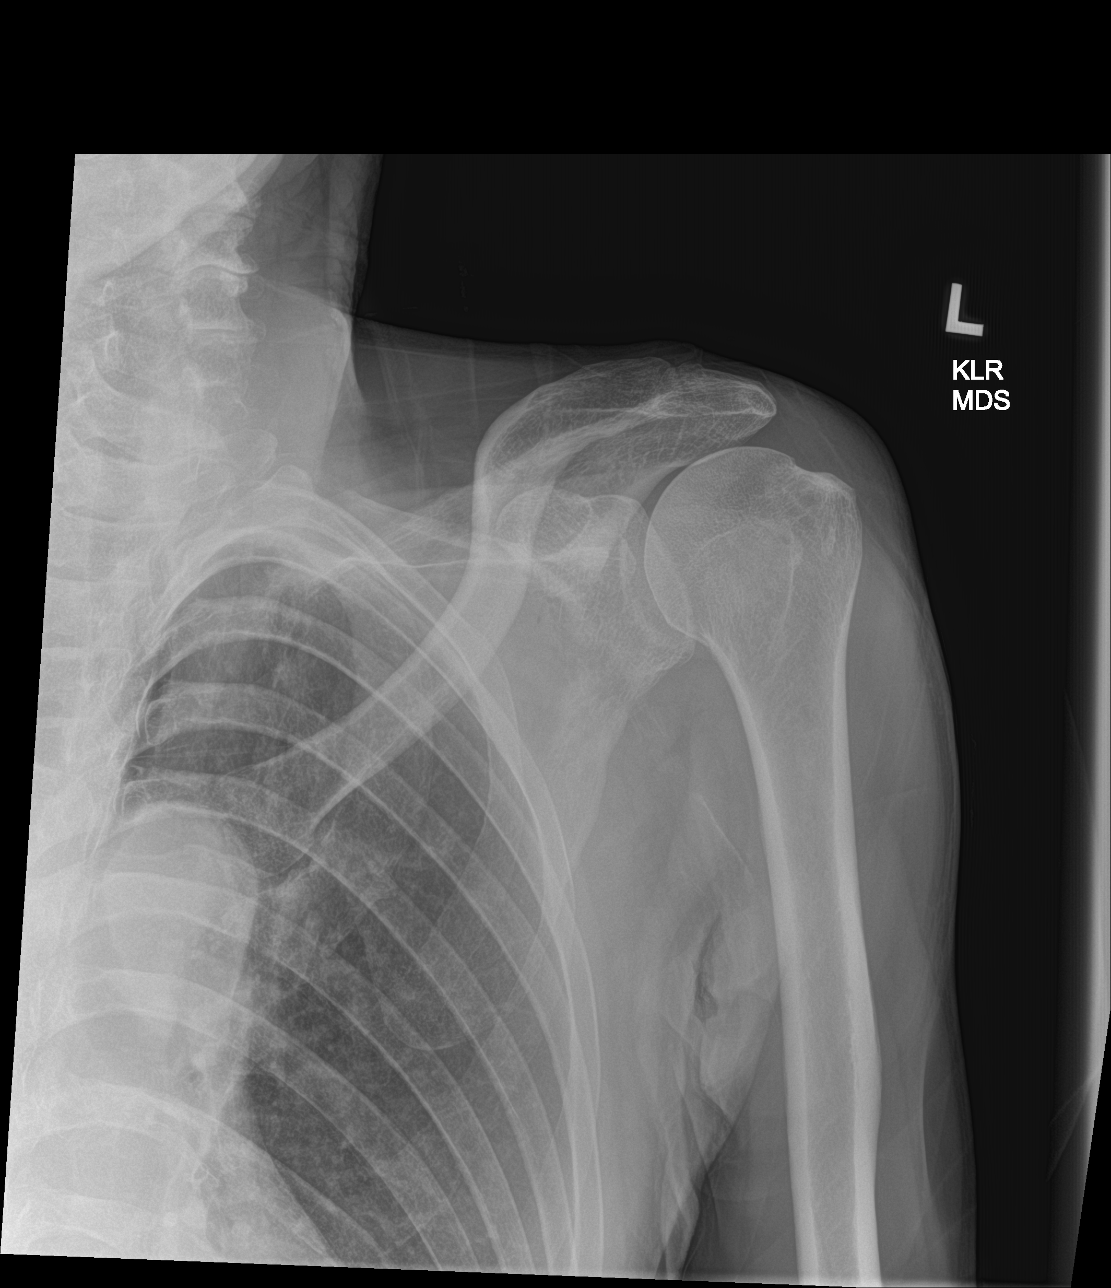

[3 of 3 positions shown; findings below may reference images not displayed]

FINDINGS: Osseous mineralization low normal.

Joint spaces preserved.

No acute fracture, dislocation or bone destruction.

Visualized LEFT ribs intact.
IMPRESSION: Normal exam.

## 2020-12-02 ENCOUNTER — Other Ambulatory Visit: Payer: Self-pay

## 2020-12-03 ENCOUNTER — Encounter: Payer: Self-pay | Admitting: *Deleted

## 2021-01-08 ENCOUNTER — Ambulatory Visit: Payer: Medicare HMO | Admitting: Gastroenterology

## 2021-03-30 ENCOUNTER — Other Ambulatory Visit: Payer: Self-pay

## 2021-04-02 ENCOUNTER — Ambulatory Visit: Payer: Medicare Other | Admitting: Gastroenterology

## 2021-04-02 ENCOUNTER — Telehealth: Payer: Self-pay | Admitting: Gastroenterology

## 2021-04-02 ENCOUNTER — Other Ambulatory Visit: Payer: Self-pay

## 2021-04-02 NOTE — Telephone Encounter (Signed)
Patient thought that his appt was for his hearing loss in one of ears. I explained to the patient that his referral was for Diarrhea and excessive gas from [facility]. He said no he didn't need this appt and walked out of the office. This information will also be sent to PCP.

## 2021-05-20 ENCOUNTER — Telehealth: Payer: Self-pay

## 2021-05-20 NOTE — Telephone Encounter (Signed)
Pt. Ready to schedule colonscopy

## 2021-05-21 ENCOUNTER — Telehealth: Payer: Self-pay

## 2021-05-21 NOTE — Telephone Encounter (Signed)
lvm

## 2021-05-28 ENCOUNTER — Telehealth: Payer: Self-pay

## 2021-05-28 NOTE — Telephone Encounter (Signed)
[facility] Concha Se is the contact person

## 2021-05-31 ENCOUNTER — Telehealth: Payer: Self-pay

## 2021-05-31 ENCOUNTER — Other Ambulatory Visit: Payer: Self-pay

## 2021-05-31 DIAGNOSIS — Z1211 Encounter for screening for malignant neoplasm of colon: Secondary | ICD-10-CM

## 2021-05-31 MED ORDER — NA SULFATE-K SULFATE-MG SULF 17.5-3.13-1.6 GM/177ML PO SOLN: 1.0000 | Freq: Once | ORAL | 0 refills | Status: AC

## 2021-05-31 NOTE — Progress Notes (Signed)
Gastroenterology Pre-Procedure Review  Request Date: 06/15/21 Requesting Physician: Dr. Allen Norris  PATIENT REVIEW QUESTIONS: The patient responded to the following health history questions as indicated:    1. Are you having any GI issues?  Some abdominal pain 2. Do you have a personal history of Polyps? no 3. Do you have a family history of Colon Cancer or Polyps? no 4. Diabetes Mellitus? no 5. Joint replacements in the past 12 months?no 6. Major health problems in the past 3 months?no 7. Any artificial heart valves, MVP, or defibrillator?no    MEDICATIONS & ALLERGIES:    Patient reports the following regarding taking any anticoagulation/antiplatelet therapy:   Plavix, Coumadin, Eliquis, Xarelto, Lovenox, Pradaxa, Brilinta, or Effient? no Aspirin? yes (81 mg)  Patient confirms/reports the following medications:  Current Outpatient Medications  Medication Sig Dispense Refill   albuterol (VENTOLIN HFA) 108 (90 Base) MCG/ACT inhaler Inhale 1 puff into the lungs daily.     aspirin EC 81 MG tablet Take 81 mg by mouth daily.      atorvastatin (LIPITOR) 20 MG tablet Take 20 mg by mouth at bedtime.     beclomethasone (QVAR) 80 MCG/ACT inhaler Inhale 1 puff into the lungs 2 (two) times daily.     FLOVENT HFA 110 MCG/ACT inhaler      fluticasone (FLONASE) 50 MCG/ACT nasal spray Spray 2 spray into both nostrils once a day  for allergies--decrease to 1 spray once symptoms improved     montelukast (SINGULAIR) 10 MG tablet Take 1 tablet by mouth once a day  for asthma and allergies     potassium chloride SA (KLOR-CON) 20 MEQ tablet Take 1 tablet (20 mEq total) by mouth 2 (two) times daily. 10 tablet 0   SPIRIVA HANDIHALER 18 MCG inhalation capsule Inhale 1 puff as directed once a day  by mouth using inhaler     No current facility-administered medications for this visit.    Patient confirms/reports the following allergies:  Allergies  Allergen Reactions   Oxycodone     No orders of the defined  types were placed in this encounter.   AUTHORIZATION INFORMATION Primary Insurance: 1D#: Group #:  Secondary Insurance: 1D#: Group #:  SCHEDULE INFORMATION: Date: 06/15/21 Time: Location: [facility]

## 2021-05-31 NOTE — Telephone Encounter (Signed)
Spoke with Ms. Amos at [facility] and procedure has been scheduled for 06/15/21. Instructions will be mailed to patient.

## 2021-05-31 NOTE — Telephone Encounter (Signed)
Pt. Calling to schedule colonoscopy 

## 2021-06-01 ENCOUNTER — Encounter: Payer: Self-pay | Admitting: General Surgery

## 2021-06-01 NOTE — Telephone Encounter (Signed)
Procedure scheduled for 06/15/21.

## 2021-06-15 ENCOUNTER — Ambulatory Visit
Admission: RE | Admit: 2021-06-15 | Discharge: 2021-06-15 | Disposition: A | Discharge status: Home / Self Care | Payer: Medicare Other | Attending: Gastroenterology | Admitting: Gastroenterology

## 2021-06-15 ENCOUNTER — Encounter
Admission: RE | Disposition: A | Discharge status: Home / Self Care | Payer: Self-pay | Source: Home / Self Care | Attending: Gastroenterology

## 2021-06-15 ENCOUNTER — Encounter: Payer: Self-pay | Admitting: Gastroenterology

## 2021-06-15 ENCOUNTER — Ambulatory Visit: Payer: Medicare Other | Admitting: Anesthesiology

## 2021-06-15 DIAGNOSIS — Z825 Family history of asthma and other chronic lower respiratory diseases: Secondary | ICD-10-CM | POA: Diagnosis not present

## 2021-06-15 DIAGNOSIS — K635 Polyp of colon: Secondary | ICD-10-CM

## 2021-06-15 DIAGNOSIS — Z8042 Family history of malignant neoplasm of prostate: Secondary | ICD-10-CM | POA: Insufficient documentation

## 2021-06-15 DIAGNOSIS — Z7951 Long term (current) use of inhaled steroids: Secondary | ICD-10-CM | POA: Insufficient documentation

## 2021-06-15 DIAGNOSIS — F17210 Nicotine dependence, cigarettes, uncomplicated: Secondary | ICD-10-CM | POA: Diagnosis not present

## 2021-06-15 DIAGNOSIS — D124 Benign neoplasm of descending colon: Secondary | ICD-10-CM | POA: Insufficient documentation

## 2021-06-15 DIAGNOSIS — Z8249 Family history of ischemic heart disease and other diseases of the circulatory system: Secondary | ICD-10-CM | POA: Diagnosis not present

## 2021-06-15 DIAGNOSIS — Z1211 Encounter for screening for malignant neoplasm of colon: Secondary | ICD-10-CM

## 2021-06-15 DIAGNOSIS — Z885 Allergy status to narcotic agent status: Secondary | ICD-10-CM | POA: Diagnosis not present

## 2021-06-15 DIAGNOSIS — Z79899 Other long term (current) drug therapy: Secondary | ICD-10-CM | POA: Insufficient documentation

## 2021-06-15 DIAGNOSIS — D122 Benign neoplasm of ascending colon: Secondary | ICD-10-CM | POA: Insufficient documentation

## 2021-06-15 HISTORY — PX: COLONOSCOPY WITH PROPOFOL: SHX5780

## 2021-06-15 SURGERY — COLONOSCOPY WITH PROPOFOL
Anesthesia: General

## 2021-06-15 MED ORDER — SODIUM CHLORIDE 0.9 % IV SOLN: INTRAVENOUS | Status: DC

## 2021-06-15 MED ORDER — LIDOCAINE HCL (CARDIAC) PF 100 MG/5ML IV SOSY
PREFILLED_SYRINGE | INTRAVENOUS | Status: DC | PRN
  Administered 2021-06-15: 40 mg via INTRAVENOUS

## 2021-06-15 MED ORDER — PROPOFOL 500 MG/50ML IV EMUL
INTRAVENOUS | Status: DC | PRN
  Administered 2021-06-15: 150 ug/kg/min via INTRAVENOUS

## 2021-06-15 MED ORDER — PROPOFOL 10 MG/ML IV BOLUS
INTRAVENOUS | Status: DC | PRN
  Administered 2021-06-15: 80 mg via INTRAVENOUS

## 2021-06-15 MED ORDER — PROPOFOL 500 MG/50ML IV EMUL
INTRAVENOUS | Status: AC
  Filled 2021-06-15: qty 50

## 2021-06-15 NOTE — Anesthesia Preprocedure Evaluation (Signed)
Anesthesia Evaluation  Patient identified by MRN, date of birth, ID band Patient awake    Reviewed: Allergy & Precautions, NPO status , Patient's Chart, lab work & pertinent test results  History of Anesthesia Complications Negative for: history of anesthetic complications  Airway Mallampati: IV   Neck ROM: Full    Dental  (+) Lower Dentures, Upper Dentures   Pulmonary COPD, Current Smoker (1/2 ppd)Patient did not abstain from smoking.,    Pulmonary exam normal breath sounds clear to auscultation       Cardiovascular Exercise Tolerance: Good negative cardio ROS Normal cardiovascular exam Rhythm:Regular Rate:Normal     Neuro/Psych negative neurological ROS     GI/Hepatic negative GI ROS,   Endo/Other  negative endocrine ROS  Renal/GU negative Renal ROS     Musculoskeletal   Abdominal   Peds  Hematology negative hematology ROS (+)   Anesthesia Other Findings   Reproductive/Obstetrics                             Anesthesia Physical Anesthesia Plan  ASA: 2  Anesthesia Plan: General   Post-op Pain Management:    Induction: Intravenous  PONV Risk Score and Plan: 1 and Propofol infusion, TIVA and Treatment may vary due to age or medical condition  Airway Management Planned: Natural Airway  Additional Equipment:   Intra-op Plan:   Post-operative Plan:   Informed Consent: I have reviewed the patients History and Physical, chart, labs and discussed the procedure including the risks, benefits and alternatives for the proposed anesthesia with the patient or authorized representative who has indicated his/her understanding and acceptance.       Plan Discussed with: CRNA  Anesthesia Plan Comments:         Anesthesia Quick Evaluation

## 2021-06-15 NOTE — Op Note (Signed)
[facility] Gastroenterology Patient Name: Tommy Avila Procedure Date: 06/15/2021 9:51 AM MRN: [id number] Account #: [id number] Date of Birth: [birth year] Admit Type: Outpatient Age: 70 Room: [facility] ENDO ROOM 4 Gender: Male Note Status: Finalized Instrument Name: Jasper Riling [id number] Procedure:             Colonoscopy Indications:           Screening for colorectal malignant neoplasm Providers:             [name] MD, MD Medicines:             Propofol per Anesthesia Complications:         No immediate complications. Procedure:             Pre-Anesthesia Assessment:                        - Prior to the procedure, a History and Physical was                         performed, and patient medications and allergies were                         reviewed. The patient's tolerance of previous                         anesthesia was also reviewed. The risks and benefits                         of the procedure and the sedation options and risks                         were discussed with the patient. All questions were                         answered, and informed consent was obtained. Prior                         Anticoagulants: The patient has taken no previous                         anticoagulant or antiplatelet agents. ASA Grade                         Assessment: II - A patient with mild systemic disease.                         After reviewing the risks and benefits, the patient                         was deemed in satisfactory condition to undergo the                         procedure.                        After obtaining informed consent, the colonoscope was                         passed under direct vision. Throughout the procedure,  the patient's blood pressure, pulse, and oxygen                         saturations were monitored continuously. The                         Colonoscope was introduced through the anus and                          advanced to the the cecum, identified by appendiceal                         orifice and ileocecal valve. The colonoscopy was                         performed without difficulty. The patient tolerated                         the procedure well. The quality of the bowel                         preparation was excellent. Findings:      The perianal and digital rectal examinations were normal.      A 7 mm polyp was found in the ascending colon. The polyp was sessile.       The polyp was removed with a cold snare. Resection and retrieval were       complete.      A 4 mm polyp was found in the descending colon. The polyp was sessile.       The polyp was removed with a cold snare. Resection and retrieval were       complete. Impression:            - One 7 mm polyp in the ascending colon, removed with                         a cold snare. Resected and retrieved.                        - One 4 mm polyp in the descending colon, removed with                         a cold snare. Resected and retrieved. Recommendation:        - Discharge patient to home.                        - Resume previous diet.                        - Continue present medications.                        - Await pathology results.                        - Repeat colonoscopy is not recommended for                         surveillance. Procedure Code(s):     --- Professional ---  45385, Colonoscopy, flexible; with removal of                         tumor(s), polyp(s), or other lesion(s) by snare                         technique Diagnosis Code(s):     --- Professional ---                        Z12.11, Encounter for screening for malignant neoplasm                         of colon                        K63.5, Polyp of colon CPT copyright 2019 American Medical Association. All rights reserved. The codes documented in this report are preliminary and upon coder review may  be revised to meet  current compliance requirements. [name] MD, MD 06/15/2021 10:29:55 AM This report has been signed electronically. Number of Addenda: 0 Note Initiated On: 06/15/2021 9:51 AM Scope Withdrawal Time: 0 hours 11 minutes 58 seconds  Total Procedure Duration: 0 hours 14 minutes 53 seconds  Estimated Blood Loss:  Estimated blood loss: none.      [facility]

## 2021-06-15 NOTE — Anesthesia Postprocedure Evaluation (Signed)
Anesthesia Post Note  Patient: Tommy Avila  Procedure(s) Performed: COLONOSCOPY WITH PROPOFOL  Patient location during evaluation: PACU Anesthesia Type: General Level of consciousness: awake and alert, oriented and patient cooperative Pain management: pain level controlled Vital Signs Assessment: post-procedure vital signs reviewed and stable Respiratory status: spontaneous breathing, nonlabored ventilation and respiratory function stable Cardiovascular status: blood pressure returned to baseline and stable Postop Assessment: adequate PO intake Anesthetic complications: no   No notable events documented.   Last Vitals:  Vitals:   06/15/21 1040 06/15/21 1050  BP: (!) 87/68 124/70  Pulse: 65 68  Resp: 15 14  Temp:    SpO2: 97% 99%    Last Pain:  Vitals:   06/15/21 1030  TempSrc: Temporal  PainSc:                  Darrin Nipper

## 2021-06-15 NOTE — Transfer of Care (Signed)
Immediate Anesthesia Transfer of Care Note  Patient: Tommy Avila  Procedure(s) Performed: Procedure(s): COLONOSCOPY WITH PROPOFOL (N/A)  Patient Location: PACU and Endoscopy Unit  Anesthesia Type:General  Level of Consciousness: sedated  Airway & Oxygen Therapy: Patient Spontanous Breathing and Patient connected to nasal cannula oxygen  Post-op Assessment: Report given to RN and Post -op Vital signs reviewed and stable  Post vital signs: Reviewed and stable  Last Vitals:  Vitals:   06/15/21 0939 06/15/21 1030  BP: 126/71 (!) 92/56  Pulse: 73 75  Resp: 18 16  Temp: (!) 35.6 C (!) 35.6 C  SpO2: 155% 20%    Complications: No apparent anesthesia complications

## 2021-06-15 NOTE — H&P (Signed)
Tommy Lame, MD [address] [place], [place] [address] Phone: [phone] Fax : [phone]  Primary Care Physician:  Theotis Burrow, MD Primary Gastroenterologist:  Dr. Allen Norris  Pre-Procedure History & Physical: HPI:  Tommy Avila is a 70 y.o. male is here for a screening colonoscopy.   Past Medical History:  Diagnosis Date   COPD (chronic obstructive pulmonary disease) ([place])    Inguinal hernia    left   Psoriasis    Sensory ataxia    Vertebral artery stenosis, left     Past Surgical History:  Procedure Laterality Date   BACK SURGERY     INGUINAL HERNIA REPAIR Left 05/22/2019   Procedure: HERNIA REPAIR INGUINAL ADULT;  Surgeon: Fredirick Maudlin, MD;  Location: [facility] ORS;  Service: General;  Laterality: Left;   SHOULDER SURGERY Left     Prior to Admission medications   Medication Sig Start Date End Date Taking? Authorizing Provider  albuterol (VENTOLIN HFA) 108 (90 Base) MCG/ACT inhaler Inhale 1 puff into the lungs daily.    [provider]  aspirin EC 81 MG tablet Take 81 mg by mouth daily.  07/14/14   [provider]  atorvastatin (LIPITOR) 20 MG tablet Take 20 mg by mouth at bedtime. 11/17/20   [provider]  beclomethasone (QVAR) 80 MCG/ACT inhaler Inhale 1 puff into the lungs 2 (two) times daily.    [provider]  FLOVENT [facility] 110 MCG/ACT inhaler  02/04/19   [provider]  fluticasone Asencion Islam) 50 MCG/ACT nasal spray Spray 2 spray into both nostrils once a day  for allergies--decrease to 1 spray once symptoms improved 11/17/20   [provider]  montelukast (SINGULAIR) 10 MG tablet Take 1 tablet by mouth once a day  for asthma and allergies 11/17/20   [provider]  potassium chloride SA (KLOR-CON) 20 MEQ tablet Take 1 tablet (20 mEq total) by mouth 2 (two) times daily. 05/20/19   Fredirick Maudlin, MD  SPIRIVA HANDIHALER 18 MCG inhalation capsule Inhale 1 puff as directed once a day   by mouth using inhaler 11/17/20   [provider]    Allergies as of 05/31/2021 - Review Complete 06/11/2019  Allergen Reaction Noted   Oxycodone  03/18/2019    Family History  Problem Relation Age of Onset   Heart attack Mother    Emphysema Sister    Prostate cancer Brother     Social History   Socioeconomic History   Marital status: Single    Spouse name: Not on file   Number of children: Not on file   Years of education: Not on file   Highest education level: Not on file  Occupational History   Not on file  Tobacco Use   Smoking status: Every Day    Packs/day: 0.50    Types: Cigarettes   Smokeless tobacco: Never  Vaping Use   Vaping Use: Never used  Substance and Sexual Activity   Alcohol use: No    Comment: 3 per week   Drug use: Never   Sexual activity: Not on file  Other Topics Concern   Not on file  Social History Narrative   Not on file   Social Determinants of Health   Financial Resource Strain: Not on file  Food Insecurity: Not on file  Transportation Needs: Not on file  Physical Activity: Not on file  Stress: Not on file  Social Connections: Not on file  Intimate Partner Violence: Not on file  Review of Systems: See HPI, otherwise negative ROS  Physical Exam: BP 126/71   Pulse 73   Temp (!) 96.1 F (35.6 C) (Temporal)   Resp 18   Ht 6\' 1"  (1.854 m)   Wt 68 kg   SpO2 100%   BMI 19.79 kg/m  General:   Alert,  pleasant and cooperative in NAD Head:  Normocephalic and atraumatic. Neck:  Supple; no masses or thyromegaly. Lungs:  Clear throughout to auscultation.    Heart:  Regular rate and rhythm. Abdomen:  Soft, nontender and nondistended. Normal bowel sounds, without guarding, and without rebound.   Neurologic:  Alert and  oriented x4;  grossly normal neurologically.  Impression/Plan: DARTANION TEO is now here to undergo a screening colonoscopy.  Risks, benefits, and alternatives regarding colonoscopy have been reviewed  with the patient.  Questions have been answered.  All parties agreeable.

## 2021-06-16 ENCOUNTER — Encounter: Payer: Self-pay | Admitting: Gastroenterology

## 2021-06-16 LAB — SURGICAL PATHOLOGY

## 2021-10-05 ENCOUNTER — Other Ambulatory Visit: Payer: Self-pay | Admitting: Surgery

## 2021-10-13 DIAGNOSIS — Z8616 Personal history of COVID-19: Secondary | ICD-10-CM

## 2021-10-13 HISTORY — DX: Personal history of COVID-19: Z86.16

## 2021-10-18 ENCOUNTER — Encounter: Payer: Self-pay | Admitting: Surgery

## 2021-10-19 ENCOUNTER — Encounter
Admission: RE | Admit: 2021-10-19 | Discharge: 2021-10-19 | Disposition: A | Discharge status: Home / Self Care | Payer: Medicare Other | Source: Ambulatory Visit | Attending: Surgery | Admitting: Surgery

## 2021-10-19 ENCOUNTER — Other Ambulatory Visit: Payer: Self-pay

## 2021-10-19 VITALS — BP 138/71 | HR 66 | Resp 18 | Ht 73.0 in | Wt 144.4 lb

## 2021-10-19 DIAGNOSIS — Z01812 Encounter for preprocedural laboratory examination: Secondary | ICD-10-CM

## 2021-10-19 DIAGNOSIS — Z01818 Encounter for other preprocedural examination: Secondary | ICD-10-CM | POA: Diagnosis not present

## 2021-10-19 HISTORY — DX: Dyspnea, unspecified: R06.00

## 2021-10-19 HISTORY — DX: Unspecified osteoarthritis, unspecified site: M19.90

## 2021-10-19 NOTE — Pre-Procedure Instructions (Addendum)
Mr, Tommy Avila arrived to his in- person PAT appointment as schedule, during his interview, he reports that he tested + for Covid on 10/15/21 by a Covid home test. He is symptomatic today with nasal congestion. This Probation officer informed patient that we would need to reschedule him after 10 days to do his lab work, Anesthesia interview was completed over the phone by this Probation officer. Mr. Tommy Avila voiced that he understands. He will be rescheduled for 10/26/21 for his labs. ?

## 2021-10-19 NOTE — Patient Instructions (Addendum)
Your procedure is scheduled on: 11/02/21 - Tuesday ?Report to the Registration Desk on the 1st floor of the [facility]. ?To find out your arrival time, please call [phone] between 1PM - 3PM on: 11/01/21 - Monday  ? ?REMEMBER: ?Instructions that are not followed completely may result in serious medical risk, up to and including death; or upon the discretion of your surgeon and anesthesiologist your surgery may need to be rescheduled. ? ?Do not eat food after midnight the night before surgery.  ?No gum chewing, lozengers or hard candies. ? ?You may however, drink CLEAR liquids up to 2 hours before you are scheduled to arrive for your surgery. Do not drink anything within 2 hours of your scheduled arrival time. ? ?Clear liquids include: ?- water  ?- apple juice without pulp ?- gatorade (not RED colors) ?- black coffee or tea (Do NOT add milk or creamers to the coffee or tea) ?Do NOT drink anything that is not on this list. ? ?In addition, your doctor has ordered for you to drink the provided  ?Ensure Pre-Surgery Clear Carbohydrate Drink  ?Drinking this carbohydrate drink up to two hours before surgery helps to reduce insulin resistance and improve patient outcomes. Please complete drinking 2 hours prior to scheduled arrival time. ? ?TAKE THESE MEDICATIONS THE MORNING OF SURGERY WITH A SIP OF WATER: NONE ? ?Use inhaler albuterol on the day of surgery and bring to the hospital. ? ?One week prior to surgery: ?Stop Anti-inflammatories (NSAIDS) such as Advil, Aleve, Ibuprofen, Motrin, Naproxen, Naprosyn and Aspirin based products such as Excedrin, Goodys Powder, BC Powder. ? ?Stop ANY OVER THE COUNTER supplements until after surgery. ? ?You may however, continue to take Tylenol if needed for pain up until the day of surgery. ? ?No Alcohol for 24 hours before or after surgery. ? ?No Smoking including e-cigarettes for 24 hours prior to surgery.  ?No chewable tobacco products for at least 6 hours prior to surgery.   ?No nicotine patches on the day of surgery. ? ?Do not use any "recreational" drugs for at least a week prior to your surgery.  ?Please be advised that the combination of cocaine and anesthesia may have negative outcomes, up to and including death. ?If you test positive for cocaine, your surgery will be cancelled. ? ?On the morning of surgery brush your teeth with toothpaste and water, you may rinse your mouth with mouthwash if you wish. ?Do not swallow any toothpaste or mouthwash. ? ?Use CHG Soap or wipes as directed on instruction sheet. ? ?Do not wear jewelry, make-up, hairpins, clips or nail polish. ? ?Do not wear lotions, powders, or perfumes.  ? ?Do not shave body from the neck down 48 hours prior to surgery just in case you cut yourself which could leave a site for infection.  ?Also, freshly shaved skin may become irritated if using the CHG soap. ? ?Contact lenses, hearing aids and dentures may not be worn into surgery. ? ?Do not bring valuables to the hospital. [facility] is not responsible for any missing/lost belongings or valuables.  ? ?Notify your doctor if there is any change in your medical condition (cold, fever, infection). ? ?Wear comfortable clothing (specific to your surgery type) to the hospital. ? ?After surgery, you can help prevent lung complications by doing breathing exercises.  ?Take deep breaths and cough every 1-2 hours. Your doctor may order a device called an Incentive Spirometer to help you take deep breaths. ?When coughing or sneezing, hold a pillow  firmly against your incision with both hands. This is called ?splinting.? Doing this helps protect your incision. It also decreases belly discomfort. ? ?If you are being admitted to the hospital overnight, leave your suitcase in the car. ?After surgery it may be brought to your room. ? ?If you are being discharged the day of surgery, you will not be allowed to drive home. ?You will need a responsible adult (18 years or older) to drive you  home and stay with you that night.  ? ?If you are taking public transportation, you will need to have a responsible adult (18 years or older) with you. ?Please confirm with your physician that it is acceptable to use public transportation.  ? ?Please call the [facility] Dept. at [phone] if you have any questions about these instructions. ? ?Surgery Visitation Policy: ? ?Patients undergoing a surgery or procedure may have one family member or support person with them as long as that person is not COVID-19 positive or experiencing its symptoms.  ?That person may remain in the waiting area during the procedure and may rotate out with other people. ? ?Inpatient Visitation:   ? ?Visiting hours are 7 a.m. to 8 p.m. ?Up to two visitors ages 16+ are allowed at one time in a patient room. The visitors may rotate out with other people during the day. Visitors must check out when they leave, or other visitors will not be allowed. One designated support person may remain overnight. ?The visitor must pass COVID-19 screenings, use hand sanitizer when entering and exiting the patient?s room and wear a mask at all times, including in the patient?s room. ?Patients must also wear a mask when staff or their visitor are in the room. ?Masking is required regardless of vaccination status.  ?

## 2021-10-26 ENCOUNTER — Inpatient Hospital Stay: Admission: RE | Admit: 2021-10-26 | Payer: Medicare Other | Source: Ambulatory Visit

## 2021-10-26 ENCOUNTER — Other Ambulatory Visit: Payer: Self-pay

## 2021-10-26 ENCOUNTER — Other Ambulatory Visit
Admission: RE | Admit: 2021-10-26 | Discharge: 2021-10-26 | Disposition: A | Discharge status: Home / Self Care | Payer: Medicare Other | Source: Ambulatory Visit | Attending: Surgery | Admitting: Surgery

## 2021-10-26 DIAGNOSIS — Z01818 Encounter for other preprocedural examination: Secondary | ICD-10-CM | POA: Insufficient documentation

## 2021-10-26 DIAGNOSIS — Z01810 Encounter for preprocedural cardiovascular examination: Secondary | ICD-10-CM | POA: Diagnosis not present

## 2021-10-26 LAB — URINALYSIS, COMPLETE (UACMP) WITH MICROSCOPIC
Bacteria, UA: NONE SEEN
Bilirubin Urine: NEGATIVE
Glucose, UA: NEGATIVE mg/dL
Ketones, ur: NEGATIVE mg/dL
Leukocytes,Ua: NEGATIVE
Nitrite: NEGATIVE
Protein, ur: NEGATIVE mg/dL
Specific Gravity, Urine: 1.009 (ref 1.005–1.030)
WBC, UA: NONE SEEN WBC/hpf (ref 0–5)
pH: 5 (ref 5.0–8.0)

## 2021-10-26 LAB — CBC
HCT: 43.7 % (ref 39.0–52.0)
Hemoglobin: 14.1 g/dL (ref 13.0–17.0)
MCH: 31.3 pg (ref 26.0–34.0)
MCHC: 32.3 g/dL (ref 30.0–36.0)
MCV: 97.1 fL (ref 80.0–100.0)
Platelets: 273 10*3/uL (ref 150–400)
RBC: 4.5 MIL/uL (ref 4.22–5.81)
RDW: 13.1 % (ref 11.5–15.5)
WBC: 7.9 10*3/uL (ref 4.0–10.5)
nRBC: 0 % (ref 0.0–0.2)

## 2021-10-26 LAB — COMPREHENSIVE METABOLIC PANEL
ALT: 15 U/L (ref 0–44)
AST: 18 U/L (ref 15–41)
Albumin: 4.1 g/dL (ref 3.5–5.0)
Alkaline Phosphatase: 81 U/L (ref 38–126)
Anion gap: 7 (ref 5–15)
BUN: 13 mg/dL (ref 8–23)
CO2: 25 mmol/L (ref 22–32)
Calcium: 9 mg/dL (ref 8.9–10.3)
Chloride: 106 mmol/L (ref 98–111)
Creatinine, Ser: 0.8 mg/dL (ref 0.61–1.24)
GFR, Estimated: >60 mL/min (ref >60)
Glucose, Bld: 86 mg/dL (ref 70–99)
Potassium: 3.3 mmol/L — ABNORMAL LOW (ref 3.5–5.1)
Sodium: 138 mmol/L (ref 135–145)
Total Bilirubin: 0.7 mg/dL (ref 0.3–1.2)
Total Protein: 7.2 g/dL (ref 6.5–8.1)

## 2021-10-26 LAB — TYPE AND SCREEN
ABO/RH(D): A POS
Antibody Screen: NEGATIVE

## 2021-10-26 LAB — SURGICAL PCR SCREEN
MRSA, PCR: NEGATIVE
Staphylococcus aureus: NEGATIVE

## 2021-10-29 ENCOUNTER — Other Ambulatory Visit: Payer: Medicare Other

## 2021-11-02 ENCOUNTER — Ambulatory Visit: Payer: Medicare Other | Admitting: Urgent Care

## 2021-11-02 ENCOUNTER — Observation Stay
Admission: RE | Admit: 2021-11-02 | Discharge: 2021-11-03 | Disposition: A | Discharge status: Home Health | Payer: Medicare Other | Attending: Surgery | Admitting: Surgery

## 2021-11-02 ENCOUNTER — Encounter
Admission: RE | Disposition: A | Discharge status: Home Health | Payer: Self-pay | Source: Home / Self Care | Attending: Surgery

## 2021-11-02 ENCOUNTER — Other Ambulatory Visit: Payer: Self-pay

## 2021-11-02 ENCOUNTER — Encounter: Payer: Self-pay | Admitting: Surgery

## 2021-11-02 ENCOUNTER — Observation Stay: Payer: Medicare Other

## 2021-11-02 DIAGNOSIS — Z8616 Personal history of COVID-19: Secondary | ICD-10-CM | POA: Insufficient documentation

## 2021-11-02 DIAGNOSIS — Z7982 Long term (current) use of aspirin: Secondary | ICD-10-CM | POA: Insufficient documentation

## 2021-11-02 DIAGNOSIS — F17210 Nicotine dependence, cigarettes, uncomplicated: Secondary | ICD-10-CM | POA: Diagnosis not present

## 2021-11-02 DIAGNOSIS — R0609 Other forms of dyspnea: Secondary | ICD-10-CM | POA: Diagnosis not present

## 2021-11-02 DIAGNOSIS — J449 Chronic obstructive pulmonary disease, unspecified: Secondary | ICD-10-CM | POA: Insufficient documentation

## 2021-11-02 DIAGNOSIS — M1611 Unilateral primary osteoarthritis, right hip: Principal | ICD-10-CM | POA: Insufficient documentation

## 2021-11-02 DIAGNOSIS — Z7951 Long term (current) use of inhaled steroids: Secondary | ICD-10-CM | POA: Insufficient documentation

## 2021-11-02 DIAGNOSIS — I6502 Occlusion and stenosis of left vertebral artery: Secondary | ICD-10-CM | POA: Diagnosis not present

## 2021-11-02 DIAGNOSIS — R1030 Lower abdominal pain, unspecified: Secondary | ICD-10-CM | POA: Diagnosis not present

## 2021-11-02 DIAGNOSIS — Z9889 Other specified postprocedural states: Secondary | ICD-10-CM | POA: Insufficient documentation

## 2021-11-02 DIAGNOSIS — M25551 Pain in right hip: Secondary | ICD-10-CM | POA: Insufficient documentation

## 2021-11-02 DIAGNOSIS — Z8719 Personal history of other diseases of the digestive system: Secondary | ICD-10-CM | POA: Insufficient documentation

## 2021-11-02 DIAGNOSIS — R2689 Other abnormalities of gait and mobility: Secondary | ICD-10-CM | POA: Insufficient documentation

## 2021-11-02 DIAGNOSIS — L409 Psoriasis, unspecified: Secondary | ICD-10-CM | POA: Diagnosis not present

## 2021-11-02 DIAGNOSIS — M1711 Unilateral primary osteoarthritis, right knee: Secondary | ICD-10-CM | POA: Insufficient documentation

## 2021-11-02 DIAGNOSIS — Z96641 Presence of right artificial hip joint: Secondary | ICD-10-CM

## 2021-11-02 HISTORY — PX: TOTAL HIP ARTHROPLASTY: SHX124

## 2021-11-02 LAB — ABO/RH: ABO/RH(D): A POS

## 2021-11-02 SURGERY — ARTHROPLASTY, HIP, TOTAL,POSTERIOR APPROACH
Anesthesia: General | Site: Hip | Laterality: Right

## 2021-11-02 MED ORDER — FAMOTIDINE 20 MG PO TABS
ORAL_TABLET | ORAL | Status: AC
  Administered 2021-11-02: 20 mg via ORAL
  Filled 2021-11-02: qty 1

## 2021-11-02 MED ORDER — KETOROLAC TROMETHAMINE 0.5 % OP SOLN
1.0000 [drp] | Freq: Three times a day (TID) | OPHTHALMIC | Status: DC | PRN
Start: 2021-11-02 — End: 2021-11-03
  Administered 2021-11-02: 1 [drp] via OPHTHALMIC
  Filled 2021-11-02: qty 3

## 2021-11-02 MED ORDER — ACETAMINOPHEN 500 MG PO TABS
500.0000 mg | ORAL_TABLET | Freq: Four times a day (QID) | ORAL | Status: DC
  Administered 2021-11-02 – 2021-11-03 (×3): 500 mg via ORAL
  Filled 2021-11-02 (×3): qty 1

## 2021-11-02 MED ORDER — CHLORHEXIDINE GLUCONATE 0.12 % MT SOLN
OROMUCOSAL | Status: AC
  Administered 2021-11-02: 15 mL via OROMUCOSAL
  Filled 2021-11-02: qty 15

## 2021-11-02 MED ORDER — KETOROLAC TROMETHAMINE 15 MG/ML IJ SOLN
7.5000 mg | Freq: Four times a day (QID) | INTRAMUSCULAR | Status: AC
  Administered 2021-11-02 – 2021-11-03 (×4): 7.5 mg via INTRAVENOUS
  Filled 2021-11-02 (×4): qty 1

## 2021-11-02 MED ORDER — CEFAZOLIN SODIUM-DEXTROSE 2-4 GM/100ML-% IV SOLN
2.0000 g | INTRAVENOUS | Status: AC
  Administered 2021-11-02: 2 g via INTRAVENOUS

## 2021-11-02 MED ORDER — CEFAZOLIN SODIUM-DEXTROSE 2-4 GM/100ML-% IV SOLN
INTRAVENOUS | Status: AC
  Filled 2021-11-02: qty 100

## 2021-11-02 MED ORDER — EPHEDRINE 5 MG/ML INJ
INTRAVENOUS | Status: AC
  Filled 2021-11-02: qty 10

## 2021-11-02 MED ORDER — PHENYLEPHRINE HCL-NACL 20-0.9 MG/250ML-% IV SOLN
INTRAVENOUS | Status: DC | PRN
  Administered 2021-11-02: 15 ug/min via INTRAVENOUS

## 2021-11-02 MED ORDER — SODIUM CHLORIDE 0.9 % IV SOLN: INTRAVENOUS | Status: DC

## 2021-11-02 MED ORDER — LIDOCAINE HCL (PF) 2 % IJ SOLN
INTRAMUSCULAR | Status: AC
  Filled 2021-11-02: qty 5

## 2021-11-02 MED ORDER — FENTANYL CITRATE (PF) 100 MCG/2ML IJ SOLN
25.0000 ug | INTRAMUSCULAR | Status: AC | PRN
  Administered 2021-11-02 (×8): 25 ug via INTRAVENOUS

## 2021-11-02 MED ORDER — METOCLOPRAMIDE HCL 5 MG/ML IJ SOLN: 5.0000 mg | Freq: Three times a day (TID) | INTRAMUSCULAR | Status: DC | PRN

## 2021-11-02 MED ORDER — ONDANSETRON HCL 4 MG/2ML IJ SOLN: 4.0000 mg | Freq: Four times a day (QID) | INTRAMUSCULAR | Status: DC | PRN

## 2021-11-02 MED ORDER — TRAMADOL HCL 50 MG PO TABS
50.0000 mg | ORAL_TABLET | Freq: Four times a day (QID) | ORAL | Status: DC | PRN
  Administered 2021-11-02: 50 mg via ORAL

## 2021-11-02 MED ORDER — KETOROLAC TROMETHAMINE 15 MG/ML IJ SOLN: 7.5000 mg | Freq: Four times a day (QID) | INTRAMUSCULAR | Status: DC

## 2021-11-02 MED ORDER — BUPIVACAINE HCL (PF) 0.5 % IJ SOLN
INTRAMUSCULAR | Status: AC
  Filled 2021-11-02: qty 10

## 2021-11-02 MED ORDER — ROCURONIUM BROMIDE 100 MG/10ML IV SOLN
INTRAVENOUS | Status: DC | PRN
  Administered 2021-11-02: 10 mg via INTRAVENOUS
  Administered 2021-11-02: 60 mg via INTRAVENOUS

## 2021-11-02 MED ORDER — CEFAZOLIN SODIUM-DEXTROSE 2-4 GM/100ML-% IV SOLN
2.0000 g | Freq: Four times a day (QID) | INTRAVENOUS | Status: AC
  Administered 2021-11-02 – 2021-11-03 (×3): 2 g via INTRAVENOUS
  Filled 2021-11-02 (×3): qty 100

## 2021-11-02 MED ORDER — FENTANYL CITRATE (PF) 100 MCG/2ML IJ SOLN
INTRAMUSCULAR | Status: AC
  Filled 2021-11-02: qty 2

## 2021-11-02 MED ORDER — SEVOFLURANE IN SOLN
RESPIRATORY_TRACT | Status: AC
  Filled 2021-11-02: qty 250

## 2021-11-02 MED ORDER — ACETAMINOPHEN 10 MG/ML IV SOLN
INTRAVENOUS | Status: DC | PRN
Start: 2021-11-02 — End: 2021-11-02
  Administered 2021-11-02: 1000 mg via INTRAVENOUS

## 2021-11-02 MED ORDER — APIXABAN 2.5 MG PO TABS: 2.5000 mg | ORAL_TABLET | Freq: Two times a day (BID) | ORAL | Status: DC

## 2021-11-02 MED ORDER — HYDROCODONE-ACETAMINOPHEN 5-325 MG PO TABS
1.0000 | ORAL_TABLET | ORAL | Status: DC | PRN
  Administered 2021-11-02: 2 via ORAL
  Filled 2021-11-02: qty 2

## 2021-11-02 MED ORDER — KETOROLAC TROMETHAMINE 15 MG/ML IJ SOLN
INTRAMUSCULAR | Status: AC
  Filled 2021-11-02: qty 1

## 2021-11-02 MED ORDER — ACETAMINOPHEN 10 MG/ML IV SOLN
INTRAVENOUS | Status: AC
  Filled 2021-11-02: qty 100

## 2021-11-02 MED ORDER — ORAL CARE MOUTH RINSE: 15.0000 mL | Freq: Once | OROMUCOSAL | Status: AC

## 2021-11-02 MED ORDER — SODIUM CHLORIDE 0.9 % IV SOLN
INTRAVENOUS | Status: DC | PRN
  Administered 2021-11-02: 60 mL

## 2021-11-02 MED ORDER — BUDESONIDE 0.5 MG/2ML IN SUSP
0.5000 mg | Freq: Two times a day (BID) | RESPIRATORY_TRACT | Status: DC
  Administered 2021-11-02 – 2021-11-03 (×2): 0.5 mg via RESPIRATORY_TRACT
  Filled 2021-11-02 (×3): qty 2

## 2021-11-02 MED ORDER — BSS IO SOLN
15.0000 mL | Freq: Four times a day (QID) | INTRAOCULAR | Status: DC | PRN
  Filled 2021-11-02: qty 15

## 2021-11-02 MED ORDER — HYDROMORPHONE HCL 1 MG/ML IJ SOLN
INTRAMUSCULAR | Status: DC | PRN
  Administered 2021-11-02 (×2): .5 mg via INTRAVENOUS

## 2021-11-02 MED ORDER — HYDROMORPHONE HCL 1 MG/ML IJ SOLN
INTRAMUSCULAR | Status: AC
  Filled 2021-11-02: qty 1

## 2021-11-02 MED ORDER — FAMOTIDINE 20 MG PO TABS: 20.0000 mg | ORAL_TABLET | Freq: Once | ORAL | Status: AC

## 2021-11-02 MED ORDER — SODIUM CHLORIDE 0.9 % IR SOLN
Status: DC | PRN
  Administered 2021-11-02: 3000 mL

## 2021-11-02 MED ORDER — DIPHENHYDRAMINE HCL 12.5 MG/5ML PO ELIX: 12.5000 mg | ORAL_SOLUTION | ORAL | Status: DC | PRN

## 2021-11-02 MED ORDER — PHENYLEPHRINE HCL (PRESSORS) 10 MG/ML IV SOLN
INTRAVENOUS | Status: DC | PRN
  Administered 2021-11-02 (×3): 50 ug via INTRAVENOUS

## 2021-11-02 MED ORDER — PHENYLEPHRINE HCL (PRESSORS) 10 MG/ML IV SOLN
INTRAVENOUS | Status: AC
  Filled 2021-11-02: qty 1

## 2021-11-02 MED ORDER — TRANEXAMIC ACID 1000 MG/10ML IV SOLN
INTRAVENOUS | Status: DC | PRN
  Administered 2021-11-02: 1000 mg via TOPICAL

## 2021-11-02 MED ORDER — FENTANYL CITRATE (PF) 100 MCG/2ML IJ SOLN
INTRAMUSCULAR | Status: DC | PRN
  Administered 2021-11-02: 100 ug via INTRAVENOUS

## 2021-11-02 MED ORDER — PROPOFOL 1000 MG/100ML IV EMUL
INTRAVENOUS | Status: AC
  Filled 2021-11-02: qty 100

## 2021-11-02 MED ORDER — BUPIVACAINE LIPOSOME 1.3 % IJ SUSP
INTRAMUSCULAR | Status: AC
  Filled 2021-11-02: qty 20

## 2021-11-02 MED ORDER — KETOROLAC TROMETHAMINE 15 MG/ML IJ SOLN
15.0000 mg | Freq: Once | INTRAMUSCULAR | Status: AC
  Administered 2021-11-02: 15 mg via INTRAVENOUS

## 2021-11-02 MED ORDER — MORPHINE SULFATE (PF) 2 MG/ML IV SOLN: 1.0000 mg | INTRAVENOUS | Status: DC | PRN

## 2021-11-02 MED ORDER — SUGAMMADEX SODIUM 200 MG/2ML IV SOLN
INTRAVENOUS | Status: DC | PRN
  Administered 2021-11-02: 200 mg via INTRAVENOUS

## 2021-11-02 MED ORDER — BUPIVACAINE-EPINEPHRINE (PF) 0.5% -1:200000 IJ SOLN
INTRAMUSCULAR | Status: DC | PRN
  Administered 2021-11-02: 30 mL via PERINEURAL

## 2021-11-02 MED ORDER — CALCIUM CARBONATE 1250 (500 CA) MG PO TABS
1.0000 | ORAL_TABLET | Freq: Every day | ORAL | Status: DC
  Administered 2021-11-03: 500 mg via ORAL
  Filled 2021-11-02: qty 1

## 2021-11-02 MED ORDER — MIDAZOLAM HCL 2 MG/2ML IJ SOLN
INTRAMUSCULAR | Status: AC
  Filled 2021-11-02: qty 2

## 2021-11-02 MED ORDER — TRANEXAMIC ACID 1000 MG/10ML IV SOLN
INTRAVENOUS | Status: AC
  Filled 2021-11-02: qty 10

## 2021-11-02 MED ORDER — ALBUTEROL SULFATE (2.5 MG/3ML) 0.083% IN NEBU: 3.0000 mL | INHALATION_SOLUTION | Freq: Four times a day (QID) | RESPIRATORY_TRACT | Status: DC | PRN

## 2021-11-02 MED ORDER — ONDANSETRON HCL 4 MG PO TABS: 4.0000 mg | ORAL_TABLET | Freq: Four times a day (QID) | ORAL | Status: DC | PRN

## 2021-11-02 MED ORDER — CHLORHEXIDINE GLUCONATE 0.12 % MT SOLN: 15.0000 mL | Freq: Once | OROMUCOSAL | Status: AC

## 2021-11-02 MED ORDER — ONDANSETRON HCL 4 MG/2ML IJ SOLN
INTRAMUSCULAR | Status: AC
  Filled 2021-11-02: qty 2

## 2021-11-02 MED ORDER — BUPIVACAINE-EPINEPHRINE (PF) 0.5% -1:200000 IJ SOLN
INTRAMUSCULAR | Status: AC
  Filled 2021-11-02: qty 30

## 2021-11-02 MED ORDER — LIDOCAINE HCL (CARDIAC) PF 100 MG/5ML IV SOSY
PREFILLED_SYRINGE | INTRAVENOUS | Status: DC | PRN
  Administered 2021-11-02: 60 mg via INTRAVENOUS

## 2021-11-02 MED ORDER — TRAMADOL HCL 50 MG PO TABS
ORAL_TABLET | ORAL | Status: AC
  Filled 2021-11-02: qty 1

## 2021-11-02 MED ORDER — 0.9 % SODIUM CHLORIDE (POUR BTL) OPTIME
TOPICAL | Status: DC | PRN
Start: 2021-11-02 — End: 2021-11-02
  Administered 2021-11-02: 500 mL

## 2021-11-02 MED ORDER — BISACODYL 10 MG RE SUPP: 10.0000 mg | Freq: Every day | RECTAL | Status: DC | PRN

## 2021-11-02 MED ORDER — LACTATED RINGERS IV SOLN: INTRAVENOUS | Status: DC

## 2021-11-02 MED ORDER — ONDANSETRON HCL 4 MG/2ML IJ SOLN
INTRAMUSCULAR | Status: DC | PRN
  Administered 2021-11-02: 4 mg via INTRAVENOUS

## 2021-11-02 MED ORDER — METOCLOPRAMIDE HCL 10 MG PO TABS: 5.0000 mg | ORAL_TABLET | Freq: Three times a day (TID) | ORAL | Status: DC | PRN

## 2021-11-02 MED ORDER — FLEET ENEMA 7-19 GM/118ML RE ENEM: 1.0000 | ENEMA | Freq: Once | RECTAL | Status: DC | PRN

## 2021-11-02 MED ORDER — ACETAMINOPHEN 325 MG PO TABS: 325.0000 mg | ORAL_TABLET | Freq: Four times a day (QID) | ORAL | Status: DC | PRN

## 2021-11-02 MED ORDER — DOCUSATE SODIUM 100 MG PO CAPS
100.0000 mg | ORAL_CAPSULE | Freq: Two times a day (BID) | ORAL | Status: DC
  Administered 2021-11-02 – 2021-11-03 (×2): 100 mg via ORAL
  Filled 2021-11-02 (×3): qty 1

## 2021-11-02 MED ORDER — MAGNESIUM HYDROXIDE 400 MG/5ML PO SUSP
30.0000 mL | Freq: Every day | ORAL | Status: DC | PRN
  Administered 2021-11-02: 30 mL via ORAL
  Filled 2021-11-02: qty 30

## 2021-11-02 MED ORDER — PROPOFOL 10 MG/ML IV BOLUS
INTRAVENOUS | Status: DC | PRN
  Administered 2021-11-02: 100 mg via INTRAVENOUS
  Administered 2021-11-02: 30 mg via INTRAVENOUS

## 2021-11-02 MED ORDER — PENTAFLUOROPROP-TETRAFLUOROETH EX AERO
INHALATION_SPRAY | CUTANEOUS | Status: AC
  Filled 2021-11-02: qty 30

## 2021-11-02 MED ORDER — DEXAMETHASONE SODIUM PHOSPHATE 10 MG/ML IJ SOLN
INTRAMUSCULAR | Status: DC | PRN
  Administered 2021-11-02: 5 mg via INTRAVENOUS

## 2021-11-02 MED ORDER — MIDAZOLAM HCL 2 MG/2ML IJ SOLN
INTRAMUSCULAR | Status: DC | PRN
  Administered 2021-11-02: 1 mg via INTRAVENOUS

## 2021-11-02 MED ORDER — ONDANSETRON HCL 4 MG/2ML IJ SOLN: 4.0000 mg | Freq: Once | INTRAMUSCULAR | Status: DC | PRN

## 2021-11-02 MED ORDER — SODIUM CHLORIDE FLUSH 0.9 % IV SOLN
INTRAVENOUS | Status: AC
  Filled 2021-11-02: qty 40

## 2021-11-02 SURGICAL SUPPLY — 65 items
BIT DRILL 2.5 (BIT) ×2
BIT DRILL STRG 12.7X2.5NS (BIT) IMPLANT
BIT DRL STRG 12.7X2.5NS (BIT) ×1
BLADE SAGITTAL WIDE XTHICK NO (BLADE) ×2 IMPLANT
BLADE SURG SZ20 CARB STEEL (BLADE) ×2 IMPLANT
CHLORAPREP W/TINT 26 (MISCELLANEOUS) ×3 IMPLANT
DRAPE 3/4 80X56 (DRAPES) ×2 IMPLANT
DRAPE IMP U-DRAPE 54X76 (DRAPES) IMPLANT
DRAPE INCISE IOBAN 66X60 STRL (DRAPES) ×2 IMPLANT
DRAPE SURG 17X11 SM STRL (DRAPES) ×4 IMPLANT
DRILL BIT 4.8 (BIT) ×1 IMPLANT
DRSG MEPILEX SACRM 8.7X9.8 (GAUZE/BANDAGES/DRESSINGS) ×2 IMPLANT
DRSG OPSITE POSTOP 4X10 (GAUZE/BANDAGES/DRESSINGS) ×2 IMPLANT
ELECT BLADE 6.5 EXT (BLADE) ×1 IMPLANT
ELECT CAUTERY BLADE 6.4 (BLADE) ×2 IMPLANT
GAUZE 4X4 16PLY ~~LOC~~+RFID DBL (SPONGE) ×2 IMPLANT
GAUZE XEROFORM 1X8 LF (GAUZE/BANDAGES/DRESSINGS) ×2 IMPLANT
GLOVE SRG 8 PF TXTR STRL LF DI (GLOVE) ×1 IMPLANT
GLOVE SURG ENC MOIS LTX SZ7.5 (GLOVE) ×8 IMPLANT
GLOVE SURG ENC MOIS LTX SZ8 (GLOVE) ×8 IMPLANT
GLOVE SURG SYN 7.5  E (GLOVE) ×4
GLOVE SURG SYN 7.5 E (GLOVE) ×2 IMPLANT
GLOVE SURG SYN 7.5 PF PI (GLOVE) IMPLANT
GLOVE SURG UNDER LTX SZ8 (GLOVE) ×2 IMPLANT
GLOVE SURG UNDER POLY LF SZ8 (GLOVE) ×2
GOWN STRL REUS W/ TWL LRG LVL3 (GOWN DISPOSABLE) ×1 IMPLANT
GOWN STRL REUS W/ TWL XL LVL3 (GOWN DISPOSABLE) ×1 IMPLANT
GOWN STRL REUS W/TWL LRG LVL3 (GOWN DISPOSABLE) ×2
GOWN STRL REUS W/TWL XL LVL3 (GOWN DISPOSABLE) ×2
HANDLE YANKAUER SUCT BULB TIP (MISCELLANEOUS) ×1 IMPLANT
HEAD CERAMIC BIOLOX 36 T1 STD (Head) ×1 IMPLANT
HOLSTER ELECTROSUGICAL PENCIL (MISCELLANEOUS) ×2 IMPLANT
IV NS IRRIG 3000ML ARTHROMATIC (IV SOLUTION) ×2 IMPLANT
KIT TURNOVER KIT A (KITS) ×2 IMPLANT
LINER ACE G7 36 SZF HIGH WALL (Liner) ×1 IMPLANT
MANIFOLD NEPTUNE II (INSTRUMENTS) ×2 IMPLANT
MAT ABSORB  FLUID 56X50 GRAY (MISCELLANEOUS) ×2
MAT ABSORB FLUID 56X50 GRAY (MISCELLANEOUS) ×1 IMPLANT
NDL FILTER BLUNT 18X1 1/2 (NEEDLE) ×1 IMPLANT
NDL SAFETY ECLIPSE 18X1.5 (NEEDLE) ×2 IMPLANT
NDL SPNL 20GX3.5 QUINCKE YW (NEEDLE) ×1 IMPLANT
NEEDLE FILTER BLUNT 18X 1/2SAF (NEEDLE) ×1
NEEDLE FILTER BLUNT 18X1 1/2 (NEEDLE) ×1 IMPLANT
NEEDLE HYPO 18GX1.5 SHARP (NEEDLE) ×4
NEEDLE SPNL 20GX3.5 QUINCKE YW (NEEDLE) ×2 IMPLANT
PACK HIP PROSTHESIS (MISCELLANEOUS) ×2 IMPLANT
PENCIL SMOKE EVACUATOR (MISCELLANEOUS) ×2 IMPLANT
PIN STEINMAN 3/16 (PIN) ×2 IMPLANT
PULSAVAC PLUS IRRIG FAN TIP (DISPOSABLE) ×2
SHELL ACETABULAR 3H 54MM F (Shell) ×1 IMPLANT
SPONGE T-LAP 18X18 ~~LOC~~+RFID (SPONGE) ×8 IMPLANT
STAPLER SKIN PROX 35W (STAPLE) ×2 IMPLANT
STEM COLLARLESS FULL 13X145MM (Stem) ×1 IMPLANT
SUT TICRON 2-0 30IN 311381 (SUTURE) ×6 IMPLANT
SUT VIC AB 0 CT1 36 (SUTURE) ×2 IMPLANT
SUT VIC AB 1 CT1 36 (SUTURE) ×2 IMPLANT
SUT VIC AB 2-0 CT1 (SUTURE) ×6 IMPLANT
SYR 10ML LL (SYRINGE) ×2 IMPLANT
SYR 20ML LL LF (SYRINGE) ×2 IMPLANT
SYR 30ML LL (SYRINGE) ×6 IMPLANT
TAPE TRANSPORE STRL 2 31045 (GAUZE/BANDAGES/DRESSINGS) ×2 IMPLANT
TIP FAN IRRIG PULSAVAC PLUS (DISPOSABLE) ×1 IMPLANT
TRAP FLUID SMOKE EVACUATOR (MISCELLANEOUS) ×2 IMPLANT
WATER STERILE IRR 1000ML POUR (IV SOLUTION) ×2 IMPLANT
WATER STERILE IRR 500ML POUR (IV SOLUTION) ×2 IMPLANT

## 2021-11-02 NOTE — TOC Progression Note (Signed)
Transition of Care (TOC) - Progression Note  ? ? ?Patient Details  ?Name: Tommy Avila ?MRN: [id number] ?Date of Birth: 09/05/1950 ? ?Transition of Care (TOC) CM/SW Contact  ?Conception Oms, RN ?Phone Number: ?11/02/2021, 2:55 PM ? ?Clinical Narrative:    ? ?[facility] is set up for [facility] prior to surgery by the surgeon's office ? ?  ?  ? ?Expected Discharge Plan and Services ?  ?  ?  ?  ?  ?                ?  ?  ?  ?  ?  ?  ?  ?  ?  ?  ? ? ?Social Determinants of Health (SDOH) Interventions ?  ? ?Readmission Risk Interventions ?No flowsheet data found. ? ?

## 2021-11-02 NOTE — H&P (Signed)
History of Present Illness: ?Tommy Avila is a 71 y.o. male who presents today for his surgical history and physical for upcoming right total hip arthroplasty. Surgery is scheduled with Dr. Roland Rack on 11/02/2021. The patient denies any changes to his medical history since he was last evaluated. He denies any history of heart attack, stroke, asthma. He does have a history of COPD. No personal history of blood clots. He does smoke half pack per day. He continues to report an aching and throbbing discomfort in the right hip which he reports is an 8 out of 10 in severity. He denies any falls or trauma affecting the right leg since he was last evaluated. ? ?Past Medical History: ? COPD (chronic obstructive pulmonary disease) (CMS-H[facility])  ? Knee pain  ? Neck pain  ? 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 Psoriasis (a type of skin inflammation)  ? Sensory ataxia  ? Shoulder joint pain  ? ?Past Surgical History: ? back surgery  ? EXCISION GANGLION CYST WRIST PRIMARY  ? ?Past Family History:  ? Diabetes Mother  ? Emphysema Father  ? ?Medications: ? acetaminophen (TYLENOL) 500 MG tablet Take by mouth 1,000 mg by mouth every 6 (six) hours as needed for mild pain, fever or headache.  ? albuterol 90 mcg/actuation inhaler Inhale 2 inhalations into the lungs every 4 (four) hours as needed.  ? aspirin 81 MG EC tablet Take 81 mg by mouth once daily.  ? atorvastatin (LIPITOR) 20 MG tablet Take 20 mg by mouth once daily.  ? beclomethasone (QVAR) 80 mcg/actuation inhaler Inhale into the lungs 2 (two) times daily.  ? calcium carbonate 500 mg calcium (1,250 mg) tablet Take 1 tablet by mouth daily with breakfast  ? fluticasone propionate (FLONASE) 50 mcg/actuation nasal spray Spray 2 spray into both nostrils once a day for allergies--decrease to 1 spray once symptoms improved  ? tiotropium (SPIRIVA WITH HANDIHALER) 18 mcg inhalation capsule Inhale 1 puff as directed once a day by mouth using inhaler  ?  triamcinolone 0.1 % cream APPLY TO AFFECTED AREA TWICE A DAY FOR UP TO 2 WEEKS  ? ?Allergies: ? Oxycodone Hallucination  ? ?Review of Systems:  ?A comprehensive 14 point ROS was performed, reviewed by me today, and the pertinent orthopaedic findings are documented in the HPI. ? ?Physical Exam: ?BP 118/78  Ht 177.8 cm ('5\' 10"'$ )  Wt 68.1 kg (150 lb 3.2 oz)  BMI 21.55 kg/m?  ?General/Constitutional: The patient appears to be well-nourished, well-developed, and in no acute distress. ?Neuro/Psych: Normal mood and affect, oriented to person, place and time. ?Eyes: Non-icteric. Pupils are equal, round, and reactive to light, and exhibit synchronous movement. ?ENT: Unremarkable. ?Lymphatic: No palpable adenopathy. ?Respiratory: Lungs clear to auscultation, Normal chest excursion, No wheezes and Non-labored breathing ?Cardiovascular: Regular rate and rhythm. No murmurs. and No edema, swelling or tenderness, except as noted in detailed exam. ?Integumentary: No impressive skin lesions present, except as noted in detailed exam. ?Musculoskeletal: Unremarkable, except as noted in detailed exam. ? ?General: ?Well developed, well nourished 71 y.o. male in no apparent distress. Normal affect. Normal communication. Patient answers questions appropriately. The patient has a mild limp favoring the right leg. ? ?Right Hip: ?Examination of the right lower extremities reveals no bony abnormality, no edema, and no ecchymosis. The patient has increased pain with any attempted motion to the right hip. Increased pain with both internal and external rotation. Limited internal/external rotation due to discomfort. The patient has a positive logroll  maneuver to the right hip. Increased pain with hip flexion up to 95 degrees. Positive FABER test into the right hip. Crepitus with range of motion activities at today's visit. The patient is non tender along the greater trochanter region. The patient has a negative [name]' test bilaterally. There is  normal skin warmth. There is normal capillary refill bilaterally.  ? ?Neurologic: ?The patient has a negative straight leg raise. The patient has normal muscle strength testing for the quadriceps, calves, ankle dorsiflexion, ankle plantarflexion, and extensor hallicus longus. The patient has sensation that is intact to light touch. The deep tendon reflexes are normal at the patella and achilles. No clonus is noted.  ? ?Imaging: ?AP and lateral views of the right hip were obtained today in the office and reviewed by me. These x-rays do demonstrate what appears to be fairly significant osteoarthritic changes of the right hip with decreased superior and inferior acetabular joint space. There is moderate osteophyte formation off of the superior-inferior aspect of the femoral head with flattening of the superior aspect of the femoral head. No acute fracture or evidence of dislocation. Sclerotic changes noted to the superior aspect of the acetabulum. ? ?Impression: ?Primary osteoarthritis of right hip. ? ?Plan:  ?1. Treatment options were discussed today with the patient. ?2. The patient is scheduled for a right total hip arthroplasty with Dr. Roland Rack on 11/02/2021. ?3. The patient was discussed on the risk and benefits of surgery and wishes to proceed at this time. ?4. This document will serve as a surgical history and physical for the patient. He can contact the clinic if he has any questions, new symptoms develop or symptoms worsen. ?5. The patient will follow-up per standard postop protocol. ? ?The procedure was discussed with the patient, as were the potential risks (including bleeding, infection, nerve and/or blood vessel injury, persistent or recurrent pain, failure of the hardware, leg length discrepancy, dislocation, heterotopic ossification, need for further surgery, blood clots, strokes, heart attacks and/or arhythmias, pneumonia, etc.) and benefits. The patient states his understanding and wishes to  proceed. ? ? ?H&P reviewed and patient re-examined. No changes. ? ?

## 2021-11-02 NOTE — Plan of Care (Signed)
?  Problem: Education: ?Goal: Knowledge of General Education information will improve ?Description: Including pain rating scale, medication(s)/side effects and non-pharmacologic comfort measures ?11/02/2021 1247 by Derrek Monaco, RN ?Outcome: Progressing ?11/02/2021 1246 by Derrek Monaco, RN ?Outcome: Progressing ?  ?Problem: Health Behavior/Discharge Planning: ?Goal: Ability to manage health-related needs will improve ?11/02/2021 1247 by Derrek Monaco, RN ?Outcome: Progressing ?11/02/2021 1246 by Derrek Monaco, RN ?Outcome: Progressing ?  ?

## 2021-11-02 NOTE — Anesthesia Procedure Notes (Signed)
Procedure Name: Intubation ?Date/Time: 11/02/2021 7:36 AM ?Performed by: Cammie Sickle, CRNA ?Pre-anesthesia Checklist: Patient identified, Patient being monitored, Timeout performed, Emergency Drugs available and Suction available ?Patient Re-evaluated:Patient Re-evaluated prior to induction ?Oxygen Delivery Method: Circle system utilized ?Preoxygenation: Pre-oxygenation with 100% oxygen ?Induction Type: IV induction ?Ventilation: Mask ventilation without difficulty ?Laryngoscope Size: Mac and 4 ?Grade View: Grade I ?Tube type: Oral ?Tube size: 7.5 mm ?Number of attempts: 1 ?Airway Equipment and Method: Stylet and Oral airway ?Placement Confirmation: ETT inserted through vocal cords under direct vision, positive ETCO2 and breath sounds checked- equal and bilateral ?Secured at: 22 cm ?Tube secured with: Tape ?Dental Injury: Teeth and Oropharynx as per pre-operative assessment  ?Comments: Placed by Lucita Ferrara, SRNA under MDA and CRNA supervision ? ? ? ? ?

## 2021-11-02 NOTE — Anesthesia Postprocedure Evaluation (Signed)
Anesthesia Post Note ? ?Patient: Tommy Avila ? ?Procedure(s) Performed: TOTAL HIP ARTHROPLASTY (Right: Hip) ? ?Patient location during evaluation: PACU ?Anesthesia Type: General ?Level of consciousness: awake and alert ?Pain management: pain level controlled ?Vital Signs Assessment: post-procedure vital signs reviewed and stable ?Respiratory status: spontaneous breathing, nonlabored ventilation, respiratory function stable and patient connected to nasal cannula oxygen ?Cardiovascular status: blood pressure returned to baseline and stable ?Postop Assessment: no apparent nausea or vomiting ?Anesthetic complications: no ? ? ?No notable events documented. ? ? ?Last Vitals:  ?Vitals:  ? 11/02/21 1100 11/02/21 1207  ?BP: 112/67   ?Pulse: 70 66  ?Resp: 11 16  ?Temp: (!) 36.3 ?C   ?SpO2: 97% 95%  ?  ?Last Pain:  ?Vitals:  ? 11/02/21 1100  ?TempSrc:   ?PainSc: 4   ? ? ?  ?  ?  ?  ?  ?  ? ?Arita Miss ? ? ? ? ?

## 2021-11-02 NOTE — Plan of Care (Signed)
?  Problem: Education: ?Goal: Knowledge of General Education information will improve ?Description: Including pain rating scale, medication(s)/side effects and non-pharmacologic comfort measures ?Outcome: Progressing ?  ?Problem: Health Behavior/Discharge Planning: ?Goal: Ability to manage health-related needs will improve ?Outcome: Progressing ?  ?Problem: Clinical Measurements: ?Goal: Ability to maintain clinical measurements within normal limits will improve ?Outcome: Progressing ?Goal: Will remain free from infection ?Outcome: Progressing ?Goal: Diagnostic test results will improve ?Outcome: Progressing ?Goal: Respiratory complications will improve ?Outcome: Progressing ?Goal: Cardiovascular complication will be avoided ?Outcome: Progressing ?  ?Problem: Clinical Measurements: ?Goal: Cardiovascular complication will be avoided ?Outcome: Progressing ?  ?Problem: Clinical Measurements: ?Goal: Respiratory complications will improve ?Outcome: Progressing ?  ?

## 2021-11-02 NOTE — Anesthesia Preprocedure Evaluation (Signed)
Anesthesia Evaluation  ?Patient identified by MRN, date of birth, ID band ?Patient awake ? ? ? ?Reviewed: ?Allergy & Precautions, H&P , NPO status , Patient's Chart, lab work & pertinent test results, reviewed documented beta blocker date and time  ? ?Airway ?Mallampati: II ? ?TM Distance: >3 FB ?Neck ROM: full ? ? ? Dental ? ?(+) Edentulous Upper, Edentulous Lower ?  ?Pulmonary ?shortness of breath and with exertion, COPD,  COPD inhaler, Current Smoker,  ?  ?Pulmonary exam normal ? ? ? ? ? ? ? Cardiovascular ?Exercise Tolerance: Good ?negative cardio ROS ?Normal cardiovascular exam ?Rhythm:regular Rate:Normal ? ? ?  ?Neuro/Psych ?negative neurological ROS ? negative psych ROS  ? GI/Hepatic ?negative GI ROS, Neg liver ROS,   ?Endo/Other  ?negative endocrine ROS ? Renal/GU ?negative Renal ROS  ?negative genitourinary ?  ?Musculoskeletal ? ? Abdominal ?  ?Peds ? Hematology ?negative hematology ROS ?(+)   ?Anesthesia Other Findings ?Past Medical History: ?No date: Arthritis ?No date: COPD (chronic obstructive pulmonary disease) ([place]) ?No date: Dyspnea ?10/13/2021: History of 2019 novel coronavirus disease (COVID-19) ?    Comment:  a.) tested (+) via home test. ?No date: Inguinal hernia ?    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    Comment:  Procedure: COLONOSCOPY WITH PROPOFOL;  Surgeon: Allen Norris,  ?             Darren, MD;  Location: [place];  Service:  ?             Endoscopy;  Laterality: N/A; ?05/22/2019: INGUINAL HERNIA REPAIR; Left ?    Comment:  Procedure: HERNIA REPAIR INGUINAL ADULT;  Surgeon:  ?             Fredirick Maudlin, MD;  Location: [facility];  Service:  ?             General;  Laterality: Left; ?No date: SHOULDER SURGERY; Left ?BMI   ? Body Mass Index: 19.05 kg/m?  ?  ? Reproductive/Obstetrics ?negative OB ROS ? ?   ? ? ? ? ? ? ? ? ? ? ? ? ? ?  ?  ? ? ? ? ? ? ? ? ?Anesthesia Physical ?Anesthesia Plan ? ?ASA: 3 ? ?Anesthesia Plan: General ETT  ? ?Post-op Pain Management:   ? ?Induction:  ? ?PONV Risk Score and Plan:  ? ?Airway Management Planned:  ? ?Additional Equipment:  ? ?Intra-op Plan:  ? ?Post-operative Plan:  ? ?Informed Consent: I have reviewed the patients History and Physical, chart, labs and discussed the procedure including the risks, benefits and alternatives for the proposed anesthesia with the patient or authorized representative who has indicated his/her understanding and acceptance.  ? ? ? ?Dental Advisory Given ? ?Plan Discussed with: CRNA ? ?Anesthesia Plan Comments: (Hx. Of 2 previous back surgeries and refuses Low back injection/ SAB. JA)  ? ? ? ? ? ? ?Anesthesia Quick Evaluation ? ?

## 2021-11-02 NOTE — Op Note (Signed)
11/02/2021 ? ?10:25 AM ? ?Patient:   Tommy Avila ? ?Pre-Op Diagnosis:   Degenerative joint disease, right hip. ? ?Post-Op Diagnosis:   Same. ? ?Procedure:   Right total hip arthroplasty. ? ?Surgeon:   Pascal Lux, MD ? ?Assistant:   Benjaman Lobe, RNFA ? ?Anesthesia:   GET ? ?Findings:   As above. ? ?Complications:   None ? ?EBL:   100 cc ? ?Fluids:   1600 cc crystalloid ? ?UOP:   None ? ?TT:   None ? ?Drains:   None ? ?Closure:   Staples ? ?Implants:   Biomet press-fit system with a #13 laterally offset Echo femoral stem, a 54 mm acetabular shell with an E-poly hi-wall liner, and a 36 mm ceramic head with a +0 mm neck. ? ?Brief Clinical Note:   The patient is a 71 year old male with a history progressively worsening right hip/groin pain. His symptoms have progressed despite medications, activity modification, etc. His history and examination consistent with degenerative joint disease, confirmed by plain radiographs. The patient presents at this time for a right total hip arthroplasty.  ? ?Procedure:   The patient was brought into the operating room. After adequate spinal anesthesia was obtained, the patient was repositioned in the left lateral decubitus position and secured using a lateral hip positioner. The right hip and lower extremity were prepped with ChloroPrep solution before being draped sterilely. Preoperative antibiotics were administered. A timeout was performed to verify the appropriate surgical site.   ? ?A standard posterior approach to the hip was made through an approximately 4-5 inch incision. The incision was carried down through the subcutaneous tissues to expose the gluteal fascia and proximal end of the iliotibial band. These structures were split the length of the incision and the Charnley self-retaining hip retractor placed. The bursal tissues were swept posteriorly to expose the short external rotators. The anterior border of the piriformis tendon was identified and this plane  developed down through the capsule to enter the joint. A flap of tissue was elevated off the posterior aspect of the femoral neck and greater trochanter and retracted posteriorly. This flap included the piriformis tendon, the short external rotators, and the posterior capsule. The soft tissues were elevated off the lateral aspect of the ilium and a large Steinmann pin placed bicortically.  ? ?With the right leg aligned over the left, a drill bit was placed into the greater trochanter parallel to the Steinmann pin and the distance between these two pins measured in order to optimize leg lengths postoperatively. The drill bit was removed and the hip dislocated. The piriformis fossa was debrided of soft tissues before the intramedullary canal was accessed through this point using a triple step reamer. The canal was reamed sequentially beginning with a #7 tapered reamer and progressing to a #13 tapered reamer. This provided excellent circumferential chatter. Using the appropriate guide, a femoral neck cut was made 10-12 mm above the lesser trochanter. The femoral head was removed. ? ?Attention was directed to the acetabular side. The labrum was debrided circumferentially before the ligamentum teres was removed using a large curette. A line was drawn on the drapes corresponding to the native version of the acetabulum. This line was used as a guide while the acetabulum was reamed sequentially beginning with a 47 mm reamer and progressing to a 53 mm reamer. This provided excellent circumferential chatter. The 53 mm trial acetabulum was positioned and found to fit quite well. Therefore, the 54 mm acetabular shell was selected  and impacted into place with care taken to maintain the appropriate version. The trial high wall liner was inserted. ? ?Attention was redirected to the femoral side. A box osteotome was used to establish version before the canal was broached sequentially beginning with a #7 broach and progressing to a  #13 broach. This was left in place and several trial reductions performed using both a standard and laterally offset neck options, as well as the -6 mm, -3 mm, and +0 mm neck lengths. After removing the trial components, the "manhole cover" was placed into the apex of the acetabular shell and tightened securely. The permanent E-polyethylene hi-wall liner was impacted into the acetabular shell and its locking mechanism verified using a quarter-inch osteotome. Next, the #13 laterally offset femoral stem was impacted into place with care taken to maintain the appropriate version. A repeat trial reduction was performed using the -3 mm and +0 mm neck lengths. The +0 mm neck length demonstrated excellent stability both in extension and external rotation as well as with flexion to 90? and internal rotation beyond 70?Marland Kitchen It also was stable in the position of sleep. In addition, leg lengths appeared to be restored appropriately, both by reassessing the position of the right leg over the left, as well as by measuring the distance between the Steinmann pin and the drill bit. The 36 mm ceramic head with the +0 mm neck was impacted onto the stem of the femoral component. The Morse taper locking mechanism was verified using manual distraction before the head was relocated and placed through a range of motion with the findings as described above. ? ?The wound was copiously irrigated with sterile saline solution via the jet lavage system before the peri-incisional and pericapsular tissues were injected with 30 cc of 0.5% Sensorcaine with epinephrine and 20 cc of Exparel diluted out to 60 cc with normal saline to help with postoperative analgesia. The posterior flap was reapproximated to the posterior aspect of the greater trochanter using #2 Tycron interrupted sutures placed through drill holes. Several additional #2 Tycron interrupted sutures were used to reinforce this layer of closure. The iliotibial band was reapproximated using  #1 Vicryl interrupted sutures before the gluteal fascia was closed using a running #1 Vicryl suture. At this point, 1 g of transexemic acid in 10 cc of normal saline was injected into the joint to help reduce postoperative bleeding. The subcutaneous tissues were closed in several layers using 2-0 Vicryl interrupted sutures before the skin was closed using staples. A sterile occlusive dressing was applied to the wound. The patient was then rolled back into the supine position on his hospital bed before being awakened, extubated, and returned to the recovery room in satisfactory condition after tolerating the procedure well. ?

## 2021-11-02 NOTE — Transfer of Care (Addendum)
Immediate Anesthesia Transfer of Care Note ? ?Patient: Tommy Avila ? ?Procedure(s) Performed: TOTAL HIP ARTHROPLASTY (Right: Hip) ? ?Patient Location: PACU ? ?Anesthesia Type:General ? ?Level of Consciousness: drowsy ? ?Airway & Oxygen Therapy: Patient Spontanous Breathing and Patient connected to face mask oxygen ? ?Post-op Assessment: Report given to RN and Post -op Vital signs reviewed and stable ? ?Post vital signs: Reviewed and stable ? ?Last Vitals:  ?Vitals Value Taken Time  ?BP 132/65 11/02/21 1010  ?Temp    ?Pulse 79 11/02/21 1013  ?Resp 16 11/02/21 1013  ?SpO2 100 % 11/02/21 1013  ?Vitals shown include unvalidated device data. ? ?Last Pain:  ?Vitals:  ? 11/02/21 0610  ?TempSrc: Temporal  ?PainSc: 5   ?   ? ?  ? ?Complications: No notable events documented. ?

## 2021-11-02 NOTE — Progress Notes (Signed)
Notified patient family that he will be going to room 156 in 30 minutes per request of the floor due to a meeting. Also, notified that the patient is doing very well. ?

## 2021-11-02 NOTE — Evaluation (Addendum)
Physical Therapy Evaluation ?Patient Details ?Name: Tommy Avila ?MRN: [id number] ?DOB: 1950/12/11 ?Today's Date: 11/02/2021 ? ?History of Present Illness ? Patient is a 71 year old male with degenerative joint disease of right hip who underwent total hip arthroplasty on 11/02/21. Medical history significant for COPD, sensory ataxia, shoulder surgery, back surgery, wrist surgery, stenosis of vertebral artery, current smoker ?  ?Clinical Impression ? Patient is agreeable to PT. He reports he was independent prior to surgery and works full time as a Development worker, community.  ?Exercise, mobility, and gait training initiated today.  He ambulated 42f but needed cues for safety and proper use of the rolling walker as he initially held the rolling walker off the ground with ambulation. He demonstrated correct technique after education. 1/10 pain is reported in right hip that did not appear to worsen with walking. Reviewed all hip precautions and he required intermittent reminders during mobility efforts. Recommend to continue PT to maximize independence and facilitate return to prior level of function. Patient is hopeful to discharge home tomorrow after therapy.   ? ?Recommendations for follow up therapy are one component of a multi-disciplinary discharge planning process, led by the attending physician.  Recommendations may be updated based on patient status, additional functional criteria and insurance authorization. ? ?Follow Up Recommendations Home health PT ? ?  ?Assistance Recommended at Discharge Set up Supervision/Assistance  ?Patient can return home with the following ? Assist for transportation;Help with stairs or ramp for entrance ? ?  ?Equipment Recommendations Rolling walker (2 wheels);BSC/3in1  ?Recommendations for Other Services ?   OT consult  ?  ?Functional Status Assessment Patient has had a recent decline in their functional status and demonstrates the ability to make significant improvements in function in a reasonable  and predictable amount of time.  ? ?  ?Precautions / Restrictions Precautions ?Precautions: Fall;Posterior Hip ?Precaution Booklet Issued: Yes (comment) ?Restrictions ?Weight Bearing Restrictions: Yes ?RLE Weight Bearing: Weight bearing as tolerated  ? ?  ? ?Mobility ? Bed Mobility ?Overal bed mobility: Needs Assistance ?Bed Mobility: Supine to Sit, Sit to Supine ?  ?  ?Supine to sit: Supervision ?Sit to supine: Min assist ?  ?General bed mobility comments: intermittent RLE support provided for return to bed. cues for sequencing and safety to maintain hip precuations with mobility ?  ? ?Transfers ?Overall transfer level: Needs assistance ?Equipment used: Rolling walker (2 wheels) ?Transfers: Sit to/from Stand ?Sit to Stand: Min assist ?  ?  ?  ?  ?  ?General transfer comment: cues for hand placement. 2 bouts of standing performed with bed ?  ? ?Ambulation/Gait ?Ambulation/Gait assistance: Min guard ?Gait Distance (Feet): 50 Feet ?Assistive device: Rolling walker (2 wheels) ?Gait Pattern/deviations: Step-through pattern, Step-to pattern, Decreased stance time - right ?Gait velocity: decreased ?  ?  ?General Gait Details: patient initially attempting to walk while holding the rolling walker off the floor completely. education for safety, weight bearing status, importance of using rolling walker for support while ambulating. step to gait pattern initially progressing to step through. no increased pain is reported with walking. no dizziness reported. ? ?Stairs ?  ?  ?  ?  ?  ? ?Wheelchair Mobility ?  ? ?Modified Rankin (Stroke Patients Only) ?  ? ?  ? ?Balance Overall balance assessment: Needs assistance ?Sitting-balance support: Feet supported ?Sitting balance-Leahy Scale: Good ?  ?  ?Standing balance support: During functional activity, Bilateral upper extremity supported ?Standing balance-Leahy Scale: Fair ?Standing balance comment: with UE supported during functional activity ?  ?  ?  ?  ?  ?  ?  ?  ?  ?  ?  ?    ? ? ? ?  Pertinent Vitals/Pain Pain Assessment ?Pain Assessment: 0-10 ?Pain Score: 1  ?Pain Location: right hip ?Pain Descriptors / Indicators: Sore ?Pain Intervention(s): Limited activity within patient's tolerance, Ice applied  ? ? ?Home Living Family/patient expects to be discharged to:: Private residence ?Living Arrangements: Alone ?Available Help at Discharge: Family ?Type of Home:  (camper) ?Home Access: Stairs to enter ?  ?Entrance Stairs-Number of Steps: 3 ("small steps") ?  ?Home Layout: One level ?Home Equipment: None ?   ?  ?Prior Function Prior Level of Function : Working/employed;Independent/Modified Independent ?  ?  ?  ?  ?  ?  ?Mobility Comments: independent, works full time as a Development worker, community ?ADLs Comments: independent ?  ? ? ?Hand Dominance  ? Dominant Hand: Right ? ?  ?Extremity/Trunk Assessment  ? Upper Extremity Assessment ?Upper Extremity Assessment: LUE deficits/detail ?LUE Deficits / Details: prior left shoulder surgery with impaired shoulder range of motion at baseline. ?  ? ?Lower Extremity Assessment ?Lower Extremity Assessment: RLE deficits/detail (LLE WFL) ?RLE Deficits / Details: patient able to activate hip/knee/ankle movement within limits of hip precuations in gravity eliminated position. no knee buckling with weight bearing ?RLE Sensation: WNL ?  ? ?   ?Communication  ? Communication: No difficulties  ?Cognition Arousal/Alertness: Awake/alert ?Behavior During Therapy: [facility] for tasks assessed/performed (mildly impulsive) ?Overall Cognitive Status: Within Functional Limits for tasks assessed ?  ?  ?  ?  ?  ?  ?  ?  ?  ?  ?  ?  ?  ?  ?  ?  ?General Comments: patient is mildly impulsive and required cues for safety with mobility ?  ?  ? ?  ?General Comments General comments (skin integrity, edema, etc.): reviewed all hip precuations and provided patient with handout with precuations and HEP. patient needs intermittent cues to maintain these precuations with functional activity. ? ?  ?Exercises  Total Joint Exercises ?Ankle Circles/Pumps: AROM, Strengthening, Both, 10 reps, Supine ?Quad Sets: AROM, Strengthening, Right, 10 reps, Supine ?Hip ABduction/ADduction: AAROM, Strengthening, 10 reps, Supine, Right ?Long Arc Quad: AAROM, Strengthening, Right, 10 reps, Seated ?Other Exercises ?Other Exercises: tactile and verbal cues for exercise technique  ? ?Assessment/Plan  ?  ?PT Assessment Patient needs continued PT services  ?PT Problem List Decreased strength;Decreased range of motion;Decreased activity tolerance;Decreased balance;Decreased knowledge of use of DME;Decreased safety awareness;Decreased knowledge of precautions;Pain ? ?   ?  ?PT Treatment Interventions DME instruction;Gait training;Stair training;Functional mobility training;Therapeutic activities;Therapeutic exercise;Balance training;Neuromuscular re-education;Cognitive remediation;Patient/family education   ? ?PT Goals (Current goals can be found in the Care Plan section)  ?Acute Rehab PT Goals ?Patient Stated Goal: return home as soon as possible ?PT Goal Formulation: With patient ?Time For Goal Achievement: 11/16/21 ?Potential to Achieve Goals: Good ? ?  ?Frequency BID ?  ? ? ?Co-evaluation   ?  ?  ?  ?  ? ? ?  ?AM-PAC PT "6 Clicks" Mobility  ?Outcome Measure Help needed turning from your back to your side while in a flat bed without using bedrails?: None ?Help needed moving from lying on your back to sitting on the side of a flat bed without using bedrails?: A Little ?Help needed moving to and from a bed to a chair (including a wheelchair)?: A Little ?Help needed standing up from a chair using your arms (e.g., wheelchair or bedside chair)?: A Little ?Help needed to walk in hospital room?: A Little ?Help needed climbing 3-5 steps with a railing? : A Little ?6 Click Score: 19 ? ?  ?  End of Session   ?Activity Tolerance: Patient tolerated treatment well ?Patient left: in bed;with call bell/phone within reach;with bed alarm set;with SCD's  reapplied (ice pack applied right hip, pillow between legs to prevent crossing legs) ?Nurse Communication: Mobility status ?PT Visit Diagnosis: Other abnormalities of gait and mobility (R26.89);Pain ?Pain - Rig

## 2021-11-03 ENCOUNTER — Encounter: Payer: Self-pay | Admitting: Surgery

## 2021-11-03 DIAGNOSIS — M1611 Unilateral primary osteoarthritis, right hip: Secondary | ICD-10-CM | POA: Diagnosis not present

## 2021-11-03 MED ORDER — TRAMADOL HCL 50 MG PO TABS: 50.0000 mg | ORAL_TABLET | Freq: Four times a day (QID) | ORAL | 0 refills | Status: DC | PRN

## 2021-11-03 MED ORDER — APIXABAN 2.5 MG PO TABS: 2.5000 mg | ORAL_TABLET | Freq: Two times a day (BID) | ORAL | 0 refills | Status: DC

## 2021-11-03 NOTE — Progress Notes (Signed)
Physical Therapy Treatment ?Patient Details ?Name: Tommy Avila ?MRN: [id number] ?DOB: [birth year] ?Today's Date: 11/03/2021 ? ? ?History of Present Illness Patient is a 71 year old male with degenerative joint disease of right hip who underwent total hip arthroplasty on 11/02/21. Medical history significant for COPD, sensory ataxia, shoulder surgery, back surgery, wrist surgery, stenosis of vertebral artery, current smoker ? ?  ?PT Comments  ? ? Pt was long sitting in bed upon arriving. He is eager to get OOB and complete PT/OT so he can DC home to niece's house. Author reviewed hip precautions and what top expect going forward. Pt is slightly impulsive at times needing vcs to slow down. Overall pt moves extremely well and was able to tolerate getting OOB, ambulation > 200 ft, and performing ascending descending stairs 2 x. Pt is cleared from an acute PT standpoint to safely DC home with HHPT to follow. ?  ?Recommendations for follow up therapy are one component of a multi-disciplinary discharge planning process, led by the attending physician.  Recommendations may be updated based on patient status, additional functional criteria and insurance authorization. ? ?Follow Up Recommendations ? Home health PT ?  ?  ?Assistance Recommended at Discharge Set up Supervision/Assistance  ?Patient can return home with the following Assist for transportation;Help with stairs or ramp for entrance ?  ?Equipment Recommendations ? Rolling walker (2 wheels);BSC/3in1  ?  ?   ?Precautions / Restrictions Precautions ?Precautions: Fall;Posterior Hip ?Precaution Booklet Issued: Yes (comment) ?Restrictions ?Weight Bearing Restrictions: Yes ?RLE Weight Bearing: Weight bearing as tolerated  ?  ? ?Mobility ? Bed Mobility ?Overal bed mobility: Needs Assistance ?Bed Mobility: Supine to Sit ?  ?  ?Supine to sit: Modified independent (Device/Increase time) ?  ?  ?Transfers ?Overall transfer level: Needs assistance ?Equipment used: Rolling walker  (2 wheels) ?Transfers: Sit to/from Stand ?Sit to Stand: Modified independent (Device/Increase time) ?  ?  ?Ambulation/Gait ?Ambulation/Gait assistance: Supervision ?Gait Distance (Feet): 200 Feet ?Assistive device: Rolling walker (2 wheels) ?Gait Pattern/deviations: Step-through pattern ?Gait velocity: WNL ?  ?  ?General Gait Details: pt demonstrated safe, steady gait but needs vcs for slowing down and improved posture ? ? ?Stairs ?Stairs: Yes ?Stairs assistance: Supervision ?Stair Management: No rails, Two rails, Backwards, Forwards, Step to pattern, With walker ?Number of Stairs: 4 ?General stair comments: Pt was able to ascend/descend 4 stair 2 x without safety concern ? ? ?  ?Balance Overall balance assessment: Needs assistance ?Sitting-balance support: Feet supported ?Sitting balance-Leahy Scale: Good ?  ?  ?Standing balance support: During functional activity, Bilateral upper extremity supported ?Standing balance-Leahy Scale: Good ?  ?  ?  ?  ?Cognition Arousal/Alertness: Awake/alert ?Behavior During Therapy: [facility] for tasks assessed/performed ?Overall Cognitive Status: Within Functional Limits for tasks assessed ?  ?  ?  ?   ?   ?General Comments General comments (skin integrity, edema, etc.): reviewed hip precautions and need to continue to perform HEP. ?  ?  ? ?Pertinent Vitals/Pain Pain Assessment ?Pain Assessment: 0-10 ?Pain Score: 4  ?Pain Location: right hip ?Pain Descriptors / Indicators: Sore ?Pain Intervention(s): Limited activity within patient's tolerance, Monitored during session, Premedicated before session, Repositioned  ? ? ? ?PT Goals (current goals can now be found in the care plan section) Acute Rehab PT Goals ?Patient Stated Goal: return home as soon as possible ?Progress towards PT goals: Progressing toward goals ? ?  ?Frequency ? ? ? BID ? ? ? ?  ?PT Plan Current plan remains appropriate  ? ? ?   ?  AM-PAC PT "6 Clicks" Mobility   ?Outcome Measure ? Help needed turning from your back to your  side while in a flat bed without using bedrails?: None ?Help needed moving from lying on your back to sitting on the side of a flat bed without using bedrails?: None ?Help needed moving to and from a bed to a chair (including a wheelchair)?: None ?Help needed standing up from a chair using your arms (e.g., wheelchair or bedside chair)?: A Little ?Help needed to walk in hospital room?: A Little ?Help needed climbing 3-5 steps with a railing? : A Little ?6 Click Score: 21 ? ?  ?End of Session   ?Activity Tolerance: Patient tolerated treatment well ?Patient left: in bed;with call bell/phone within reach;with bed alarm set;with SCD's reapplied ?Nurse Communication: Mobility status ?PT Visit Diagnosis: Other abnormalities of gait and mobility (R26.89);Pain ?Pain - Right/Left: Right ?Pain - part of body: Hip ?  ? ? ?Time: 1504-1364 ?PT Time Calculation (min) (ACUTE ONLY): 25 min ? ?Charges:  $Gait Training: 8-22 mins ?$Therapeutic Activity: 8-22 mins          ?          ? ?Julaine Fusi PTA ?11/03/21, 8:19 AM  ? ?

## 2021-11-03 NOTE — Discharge Summary (Signed)
Physician Discharge Summary  ?Subjective: ?1 Day Post-Op Procedure(s) (LRB): ?TOTAL HIP ARTHROPLASTY (Right) ?Patient reports pain as mild.   ?Patient seen in rounds with Dr. Roland Rack. ?Patient is well, and has had no acute complaints or problems ?Patient is ready to go home with home health physical therapy ? ?Physician Discharge Summary  ?Patient ID: ?Tommy Avila ?MRN: [id number] ?DOB/AGE: [birth year] 71 y.o. ? ?Admit date: 11/02/2021 ?Discharge date: 11/03/2021 ? ?Admission Diagnoses: ? ?Discharge Diagnoses:  ?Principal Problem: ?  Status post total hip replacement, right ? ? ?Discharged Condition: good ? ?Hospital Course: The patient is postop day 1 from a right total hip replacement.  He is doing very well with his pain management.  The patient slept well.  He ambulated with physical therapy 50 feet yesterday and is independent to the bathroom.  He is stable with vitals. ? ?Treatments: surgery:  ?Right total hip arthroplasty. ?  ?Surgeon:   Pascal Lux, MD ?  ?Assistant:   Benjaman Lobe, RNFA ?  ?Anesthesia:   GET ?  ?Findings:   As above. ?  ?Complications:   None ?  ?EBL:   100 cc ?  ?Fluids:   1600 cc crystalloid ?  ?UOP:   None ?  ?TT:   None ?  ?Drains:   None ?  ?Closure:   Staples ?  ?Implants:   Biomet press-fit system with a #13 laterally offset Echo femoral stem, a 54 mm acetabular shell with an E-poly hi-wall liner, and a 36 mm ceramic head with a +0 mm neck. ? ?Discharge Exam: ?Blood pressure 102/74, pulse 83, temperature 97.9 ?F (36.6 ?C), resp. rate 20, height '6\' 1"'$  (1.854 m), weight 65.5 kg, SpO2 96 %. ? ? ?Disposition: Discharge disposition: 01-Home or Self Care ? ? ? ? ? ? ? ?Allergies as of 11/03/2021   ? ?   Reactions  ? Oxycodone Other (See Comments)  ? Hallucinations  ? ?  ? ?  ?Medication List  ?  ? ?TAKE these medications   ? ?acetaminophen 500 MG tablet ?Commonly known as: TYLENOL ?Take 1,000 mg by mouth every 6 (six) hours as needed for mild pain, fever or headache. ?  ?albuterol 108  (90 Base) MCG/ACT inhaler ?Commonly known as: VENTOLIN HFA ?Inhale 1-2 puffs into the lungs every 6 (six) hours as needed for wheezing or shortness of breath. ?  ?apixaban 2.5 MG Tabs tablet ?Commonly known as: ELIQUIS ?Take 1 tablet (2.5 mg total) by mouth 2 (two) times daily for 14 days. ?  ?aspirin EC 81 MG tablet ?Take 81 mg by mouth daily. ?  ?calcium carbonate 1250 (500 Ca) MG tablet ?Commonly known as: OS-CAL - dosed in mg of elemental calcium ?Take 1 tablet by mouth daily with breakfast. ?  ?Flovent HFA 110 MCG/ACT inhaler ?Generic drug: fluticasone ?Inhale 1 puff into the lungs daily as needed (shortness). ?  ?traMADol 50 MG tablet ?Commonly known as: ULTRAM ?Take 1 tablet (50 mg total) by mouth every 6 (six) hours as needed for moderate pain. ?  ?triamcinolone cream 0.1 % ?Commonly known as: KENALOG ?Apply 1 application topically 2 (two) times daily as needed (rash). ?  ? ?  ? ?  ?  ? ? ?  ?Durable Medical Equipment  ?(From admission, onward)  ?  ? ? ?  ? ?  Start     Ordered  ? 11/02/21 1215  DME Bedside commode  Once       ?Question:  Patient needs a bedside commode  to treat with the following condition  Answer:  Status post total hip replacement, right  ? 11/02/21 1215  ? 11/02/21 1215  DME 3 n 1  Once       ? 11/02/21 1215  ? 11/02/21 1215  DME Walker rolling  Once       ?Question Answer Comment  ?Walker: With 5 Inch Wheels   ?Patient needs a walker to treat with the following condition Status post total hip replacement, right   ?  ? 11/02/21 1215  ? ?  ?  ? ?  ? ? Follow-up Information   ? ? Lattie Corns, PA-C. Go in 2 week(s).   ?Specialty: Physician Assistant ?Why: For staple removal ?Contact information: ?[address] [facility] ?[place] [address] ?[phone] ? ? ?  ?  ? ?  ?  ? ?  ? ? ?Signed: ?Tommy Avila ?11/03/2021, 7:06 AM ? ? ?Objective: ?Vital signs in last 24 hours: ?Temp:  [97 ?F (36.1 ?C)-97.9 ?F (36.6 ?C)] 97.9 ?F (36.6 ?C) (03/22 0443) ?Pulse Rate:   [66-83] 83 (03/22 0443) ?Resp:  [11-20] 20 (03/22 0443) ?BP: (102-139)/(54-74) 102/74 (03/22 0443) ?SpO2:  [95 %-100 %] 96 % (03/22 0443) ? ?Intake/Output from previous day: ? ?Intake/Output Summary (Last 24 hours) at 11/03/2021 0706 ?Last data filed at 11/03/2021 0600 ?Gross per 24 hour  ?Intake 3335.09 ml  ?Output 1000 ml  ?Net 2335.09 ml  ?  ?Intake/Output this shift: ?No intake/output data recorded. ? ?Labs: ?No results for input(s): HGB in the last 72 hours. ?No results for input(s): WBC, RBC, HCT, PLT in the last 72 hours. ?No results for input(s): NA, K, CL, CO2, BUN, CREATININE, GLUCOSE, CALCIUM in the last 72 hours. ?No results for input(s): LABPT, INR in the last 72 hours. ? ?EXAM: ?General - Patient is Alert and Oriented ?Extremity - Neurovascular intact ?Sensation intact distally ?Dorsiflexion/Plantar flexion intact ?Compartment soft ?Incision - clean, dry, no drainage ?Motor Function -plantarflexion and dorsiflexion are intact. ? ?Assessment/Plan: ?1 Day Post-Op Procedure(s) (LRB): ?TOTAL HIP ARTHROPLASTY (Right) ?Procedure(s) (LRB): ?TOTAL HIP ARTHROPLASTY (Right) ?Past Medical History:  ?Diagnosis Date  ? Arthritis   ? COPD (chronic obstructive pulmonary disease) ([place])   ? Dyspnea   ? History of 2019 novel coronavirus disease (COVID-19) 10/13/2021  ? a.) tested (+) via home test.  ? Inguinal hernia   ? left  ? Psoriasis   ? Sensory ataxia   ? Vertebral artery stenosis, left   ? ?Principal Problem: ?  Status post total hip replacement, right ? ?Estimated body mass index is 19.05 kg/m? as calculated from the following: ?  Height as of this encounter: '6\' 1"'$  (1.854 m). ?  Weight as of this encounter: 65.5 kg. ?Advance diet ?Up with therapy ?D/C IV fluids ?Discharge home with home health ?Diet - Regular diet ?Follow up - in 2 weeks ?Activity - WBAT ?Disposition - Home ?Condition Upon Discharge - Stable ?DVT Prophylaxis - TED hose and Eliquis ? ?[name], PA-C ?Orthopaedic Surgery ?11/03/2021, 7:06 AM ? ?

## 2021-11-03 NOTE — Progress Notes (Signed)
?  Subjective: ?1 Day Post-Op Procedure(s) (LRB): ?TOTAL HIP ARTHROPLASTY (Right) ?Patient reports pain as mild.   ?Patient is well, and has had no acute complaints or problems ?Plan is to go Home after hospital stay. ?Negative for chest pain and shortness of breath ?Fever: no ?Gastrointestinal: Negative for nausea and vomiting ? ?Objective: ?Vital signs in last 24 hours: ?Temp:  [97 ?F (36.1 ?C)-97.9 ?F (36.6 ?C)] 97.9 ?F (36.6 ?C) (03/22 0443) ?Pulse Rate:  [66-83] 83 (03/22 0443) ?Resp:  [11-20] 20 (03/22 0443) ?BP: (102-139)/(54-74) 102/74 (03/22 0443) ?SpO2:  [95 %-100 %] 96 % (03/22 0443) ? ?Intake/Output from previous day: ? ?Intake/Output Summary (Last 24 hours) at 11/03/2021 0703 ?Last data filed at 11/03/2021 0600 ?Gross per 24 hour  ?Intake 3335.09 ml  ?Output 1000 ml  ?Net 2335.09 ml  ?  ?Intake/Output this shift: ?No intake/output data recorded. ? ?Labs: ?No results for input(s): HGB in the last 72 hours. ?No results for input(s): WBC, RBC, HCT, PLT in the last 72 hours. ?No results for input(s): NA, K, CL, CO2, BUN, CREATININE, GLUCOSE, CALCIUM in the last 72 hours. ?No results for input(s): LABPT, INR in the last 72 hours. ? ? ?EXAM ?General - Patient is Alert and Oriented ?Extremity - Neurovascular intact ?Sensation intact distally ?Intact pulses distally ?Dorsiflexion/Plantar flexion intact ?Compartment soft ?Dressing/Incision - clean, dry, no drainage ?Motor Function - intact, moving foot and toes well on exam.  Ambulated 50 feet with physical therapy.  Ambulated independently to the bathroom. ? ?Past Medical History:  ?Diagnosis Date  ? Arthritis   ? COPD (chronic obstructive pulmonary disease) ([place])   ? Dyspnea   ? History of 2019 novel coronavirus disease (COVID-19) 10/13/2021  ? a.) tested (+) via home test.  ? Inguinal hernia   ? left  ? Psoriasis   ? Sensory ataxia   ? Vertebral artery stenosis, left   ? ? ?Assessment/Plan: ?1 Day Post-Op Procedure(s) (LRB): ?TOTAL HIP ARTHROPLASTY  (Right) ?Principal Problem: ?  Status post total hip replacement, right ? ?Estimated body mass index is 19.05 kg/m? as calculated from the following: ?  Height as of this encounter: '6\' 1"'$  (1.854 m). ?  Weight as of this encounter: 65.5 kg. ?Advance diet ?Up with therapy ?D/C IV fluids ?Discharge home with home health ? ?DVT Prophylaxis - Foot Pumps, TED hose, and Eliquis ?Weight-Bearing as tolerated to right leg ? ?[name], PA-C ?Orthopaedic Surgery ?11/03/2021, 7:03 AM ? ?

## 2021-11-03 NOTE — Progress Notes (Cosign Needed)
Patient is not able to walk the distance required to go the bathroom, Unable to climb stairs to get to an upstairs bathroom,  A 3 in 1 Will eleviate this problem.  ?

## 2021-11-03 NOTE — Progress Notes (Signed)
Blood pressure 112/64, pulse 76, temperature 97.8 ?F (36.6 ?C), resp. rate 16, height '6\' 1"'$  (1.854 m), weight 65.5 kg, SpO2 97 %. ?IV removed c/d/I discussed d/c packet with pt, he verbalized understanding pt sent home with all dme and paperwork, transported pt via w/c down to private car.  ?

## 2021-11-03 NOTE — Discharge Instructions (Signed)
POSTERIOR TOTAL HIP REPLACEMENT POSTOPERATIVE DIRECTIONS ? ?Hip Rehabilitation, Guidelines Following Surgery  ?The results of a hip operation are greatly improved after range of motion and muscle strengthening exercises. Follow all safety measures which are given to protect your hip. If any of these exercises cause increased pain or swelling in your joint, decrease the amount until you are comfortable again. Then slowly increase the exercises. Call your caregiver if you have problems or questions.  ? ?HOME CARE INSTRUCTIONS  ?Remove items at home which could result in a fall. This includes throw rugs or furniture in walking pathways.  ?ICE to the affected hip every three hours for 30 minutes at a time and then as needed for pain and swelling.  Continue to use ice on the hip for pain and swelling from surgery. You may notice swelling that will progress down to the foot and ankle.  This is normal after surgery.  Elevate the leg when you are not up walking on it.   ?Continue to use the breathing machine which will help keep your temperature down.  It is common for your temperature to cycle up and down following surgery, especially at night when you are not up moving around and exerting yourself.  The breathing machine keeps your lungs expanded and your temperature down. ? ?DIET ?You may resume your previous home diet once your are discharged from the hospital. ? ?DRESSING / WOUND CARE / SHOWERING ?You may change your dressing 3-5 days after surgery.  Then change the dressing every day with sterile gauze.  Please use good hand washing techniques before changing the dressing.  Do not use any lotions or creams on the incision until instructed by your surgeon. ?You need to keep your dressing dry after discharge.   ?Change the surgical dressing if needed with Physical Therapy and reapply a dry dressing each time. ? ? ? ?ACTIVITY ?Walk with your walker as instructed. ?Use walker as long as suggested by your  caregivers. ?Avoid periods of inactivity such as sitting longer than an hour when not asleep. This helps prevent blood clots.  ?You may resume a sexual relationship in one month or when given the OK by your doctor.  ?You may return to work once you are cleared by your doctor.  ?Do not drive a car for 6 weeks or until released by you surgeon.  ?Do not drive while taking narcotics. ? ?WEIGHT BEARING ?Weight bearing as tolerated with assist device (walker, cane, etc) as directed, use it as long as suggested by your surgeon or therapist, typically at least 4-6 weeks. ? ?POSTOPERATIVE CONSTIPATION PROTOCOL ?Constipation - defined medically as fewer than three stools per week and severe constipation as less than one stool per week. ? ?One of the most common issues patients have following surgery is constipation.  Even if you have a regular bowel pattern at home, your normal regimen is likely to be disrupted due to multiple reasons following surgery.  Combination of anesthesia, postoperative narcotics, change in appetite and fluid intake all can affect your bowels.  In order to avoid complications following surgery, here are some recommendations in order to help you during your recovery period. ? ?Colace (docusate) - Pick up an over-the-counter form of Colace or another stool softener and take twice a day as long as you are requiring postoperative pain medications.  Take with a full glass of water daily.  If you experience loose stools or diarrhea, hold the colace until you stool forms back up.  If  your symptoms do not get better within 1 week or if they get worse, check with your doctor. ? ?Dulcolax (bisacodyl) - Pick up over-the-counter and take as directed by the product packaging as needed to assist with the movement of your bowels.  Take with a full glass of water.  Use this product as needed if not relieved by Colace only.  ? ?MiraLax (polyethylene glycol) - Pick up over-the-counter to have on hand.  MiraLax is a  solution that will increase the amount of water in your bowels to assist with bowel movements.  Take as directed and can mix with a glass of water, juice, soda, coffee, or tea.  Take if you go more than two days without a movement. ?Do not use MiraLax more than once per day. Call your doctor if you are still constipated or irregular after using this medication for 7 days in a row. ? ?If you continue to have problems with postoperative constipation, please contact the office for further assistance and recommendations.  If you experience "the worst abdominal pain ever" or develop nausea or vomiting, please contact the office immediatly for further recommendations for treatment. ? ?ITCHING ? If you experience itching with your medications, try taking only a single pain pill, or even half a pain pill at a time.  You can also use Benadryl over the counter for itching or also to help with sleep.  ? ?TED HOSE STOCKINGS ?Wear the elastic stockings on both legs for three weeks following surgery during the day but you may remove then at night for sleeping. ? ?MEDICATIONS ?See your medication summary on the ?After Visit Summary? that the nursing staff will review with you prior to discharge.  You may have some home medications which will be placed on hold until you complete the course of blood thinner medication.  It is important for you to complete the blood thinner medication as prescribed by your surgeon.  Continue your approved medications as instructed at time of discharge. ? ?PRECAUTIONS ?If you experience chest pain or shortness of breath - call 911 immediately for transfer to the hospital emergency department.  ?If you develop a fever greater that 101 F, purulent drainage from wound, increased redness or drainage from wound, foul odor from the wound/dressing, or calf pain - CONTACT YOUR SURGEON.   ?                                                ?FOLLOW-UP APPOINTMENTS ?Make sure you keep all of your appointments after  your operation with your surgeon and caregivers. You should call the office at the above phone number and make an appointment for approximately two weeks after the date of your surgery or on the date instructed by your surgeon outlined in the "After Visit Summary". ? ?RANGE OF MOTION AND STRENGTHENING EXERCISES  ?These exercises are designed to help you keep full movement of your hip joint. Follow your caregiver's or physical therapist's instructions. Perform all exercises about fifteen times, three times per day or as directed. Exercise both hips, even if you have had only one joint replacement. These exercises can be done on a training (exercise) mat, on the floor, on a table or on a bed. Use whatever works the best and is most comfortable for you. Use music or television while you are exercising so that the exercises  are a pleasant break in your day. This will make your life better with the exercises acting as a break in routine you can look forward to.  ?Lying on your back, slowly slide your foot toward your buttocks, raising your knee up off the floor. Then slowly slide your foot back down until your leg is straight again.  ?Lying on your back spread your legs as far apart as you can without causing discomfort.  ?Lying on your side, raise your upper leg and foot straight up from the floor as far as is comfortable. Slowly lower the leg and repeat.  ?Lying on your back, tighten up the muscle in the front of your thigh (quadriceps muscles). You can do this by keeping your leg straight and trying to raise your heel off the floor. This helps strengthen the largest muscle supporting your knee.  ?Lying on your back, tighten up the muscles of your buttocks both with the legs straight and with the knee bent at a comfortable angle while keeping your heel on the floor.  ? ? ? ? ?IF YOU ARE TRANSFERRED TO A SKILLED REHAB FACILITY ?If the patient is transferred to a skilled rehab facility following release from the  hospital, a list of the current medications will be sent to the facility for the patient to continue.  When discharged from the skilled rehab facility, please have the facility set up the patient's [facility] [place] [facility]

## 2021-11-03 NOTE — TOC Initial Note (Signed)
Transition of Care (TOC) - Initial/Assessment Note  ? ? ?Patient Details  ?Name: Tommy Avila ?MRN: [id number] ?Date of Birth: [birth year] ? ?Transition of Care (TOC) CM/SW Contact:    ?Conception Oms, RN ?Phone Number: ?11/03/2021, 9:05 AM ? ?Clinical Narrative:   The patient lives alone, his niece will be here around 57 to pick him up and will be helping him, He is set up with [facility] for hh services, He will get a 3 in 1 and a RW delivered to the room from [place]              ? ? ?  ?  ? ? ?Patient Goals and CMS Choice ?  ?  ?  ? ?Expected Discharge Plan and Services ?  ?  ?  ?  ?  ?Expected Discharge Date: 11/03/21               ?  ?  ?  ?  ?  ?  ?  ?  ?  ?  ? ?Prior Living Arrangements/Services ?  ?  ?  ?       ?  ?  ?  ?  ? ?Activities of Daily Living ?Home Assistive Devices/Equipment: Dentures (specify type) ?ADL Screening (condition at time of admission) ?Patient's cognitive ability adequate to safely complete daily activities?: Yes ?Is the patient deaf or have difficulty hearing?: Yes ?Does the patient have difficulty seeing, even when wearing glasses/contacts?: No ?Does the patient have difficulty concentrating, remembering, or making decisions?: No ?Patient able to express need for assistance with ADLs?: Yes ?Does the patient have difficulty dressing or bathing?: No ?Independently performs ADLs?: Yes (appropriate for developmental age) ?Does the patient have difficulty walking or climbing stairs?: No ?Weakness of Legs: None ?Weakness of Arms/Hands: None ? ?Permission Sought/Granted ?  ?  ?   ?   ?   ?   ? ?Emotional Assessment ?  ?  ?  ?  ?  ?  ? ?Admission diagnosis:  Status post total hip replacement, right [Z96.641] ?Patient Active Problem List  ? Diagnosis Date Noted  ? Status post total hip replacement, right 11/02/2021  ? Colon cancer screening   ? Polyp of ascending colon   ? S/P inguinal hernia repair 06/11/2019  ? Non-recurrent unilateral inguinal hernia without obstruction or gangrene    ? ?PCP:  Theotis Burrow, MD ?Pharmacy:   ?[id number] [place] [id number]?Phone: [phone]: [phone]? ? ? ? ?Social Determinants of Health (SDOH) Interventions ?  ? ?Readmission Risk Interventions ?   ? View : No data to display.  ?  ?  ?  ? ? ? ?

## 2021-11-03 NOTE — Evaluation (Signed)
Occupational Therapy Evaluation ?Patient Details ?Name: Tommy Avila ?MRN: [id number] ?DOB: 07-Nov-1950 ?Today's Date: 11/03/2021 ? ? ?History of Present Illness Patient is a 71 year old male with degenerative joint disease of right hip who underwent total hip arthroplasty on 11/02/21. Medical history significant for COPD, sensory ataxia, shoulder surgery, back surgery, wrist surgery, stenosis of vertebral artery, current smoker  ? ?Clinical Impression ?  ?Pt seen for OT evaluation this date, POD#1 from above surgery. Pt plans to discharge to niece's where she will provide assist as needed. Pt currently requires MOD A for LB dressing while in seated position with AE. All functional mobility and ADL tasks completed at supv-MOD I leve. Pt able to recall 1/3 posterior total hip precautions at start of session and unable to verbalize how to implement during ADL and mobility. Pt instructed in posterior total hip precautions and how to implement, self care skills, falls prevention strategies, home/routines modifications, DME/AE for LB bathing and dressing tasks. At end of session, pt able to recall 1/3 posterior total hip precautions, 2/3 with hand out provided. Intermittent vcs required throughout for safe use of RW and pacing while completing ADL tasks. OT will follow while admitted, no further OT follow up recommended following discharge. Pt is left as received, NAD, all needs met.  ?   ? ?Recommendations for follow up therapy are one component of a multi-disciplinary discharge planning process, led by the attending physician.  Recommendations may be updated based on patient status, additional functional criteria and insurance authorization.  ? ?Follow Up Recommendations ? No OT follow up  ?  ?Assistance Recommended at Discharge Intermittent Supervision/Assistance  ?Patient can return home with the following Assistance with cooking/housework;Assist for transportation ? ?  ?Functional Status Assessment ? Patient has had a  recent decline in their functional status and demonstrates the ability to make significant improvements in function in a reasonable and predictable amount of time.  ?Equipment Recommendations ? BSC/3in1  ?  ?Recommendations for Other Services   ? ? ?  ?Precautions / Restrictions Precautions ?Precautions: Fall;Posterior Hip ?Precaution Booklet Issued: Yes (comment) ?Restrictions ?Weight Bearing Restrictions: Yes ?RLE Weight Bearing: Weight bearing as tolerated  ? ?  ? ?Mobility Bed Mobility ?Overal bed mobility: Needs Assistance ?Bed Mobility: Supine to Sit ?  ?  ?Supine to sit: Modified independent (Device/Increase time) ?  ?  ?  ?  ? ?Transfers ?Overall transfer level: Needs assistance ?Equipment used: Rolling walker (2 wheels) ?Transfers: Sit to/from Stand ?Sit to Stand: Supervision ?  ?  ?  ?  ?  ?  ?  ? ?  ?Balance Overall balance assessment: Needs assistance ?Sitting-balance support: Feet supported ?Sitting balance-Leahy Scale: Good ?  ?  ?Standing balance support: During functional activity, Bilateral upper extremity supported ?Standing balance-Leahy Scale: Good ?  ?  ?  ?  ?  ?  ?  ?  ?  ?  ?  ?  ?   ? ?ADL either performed or assessed with clinical judgement  ? ?ADL Overall ADL's : Needs assistance/impaired ?Eating/Feeding: Independent ?  ?Grooming: Wash/dry hands;Wash/dry face;Standing;Supervision/safety ?Grooming Details (indicate cue type and reason): intermittent vcs for safety ?  ?  ?  ?  ?Upper Body Dressing : Sitting;Standing;Supervision/safety ?Upper Body Dressing Details (indicate cue type and reason): vcs to complete in sitting for safety ?Lower Body Dressing: Moderate assistance;Cueing for safety;Adhering to hip precautions ?Lower Body Dressing Details (indicate cue type and reason): vcs for adhering to hip precautions; with use of AE ?Toilet  Transfer: Supervision/safety ?  ?Toileting- Clothing Manipulation and Hygiene: Moderate assistance;Sit to/from stand ?  ?  ?  ?Functional mobility during  ADLs: Supervision/safety;Rolling walker (2 wheels) ?   ? ? ? ?Vision Patient Visual Report: No change from baseline ?   ?   ?Perception   ?  ?Praxis   ?  ? ?Pertinent Vitals/Pain Pain Assessment ?Pain Assessment: 0-10 ?Pain Score: 5  ?Pain Location: R hip ?Pain Descriptors / Indicators: Sore ?Pain Intervention(s): Limited activity within patient's tolerance, Monitored during session, Premedicated before session, Repositioned  ? ? ? ?Hand Dominance Right ?  ?Extremity/Trunk Assessment Upper Extremity Assessment ?LUE Deficits / Details: L shoulder ROM deficits at baseline ?  ?Lower Extremity Assessment ?Lower Extremity Assessment: RLE deficits/detail ?RLE Deficits / Details: s/p R THA ?  ?  ?  ?Communication Communication ?Communication: No difficulties ?  ?Cognition Arousal/Alertness: Awake/alert ?Behavior During Therapy: [facility] for tasks assessed/performed ?Overall Cognitive Status: Within Functional Limits for tasks assessed ?  ?  ?  ?  ?  ?  ?  ?  ?  ?  ?  ?  ?  ?  ?  ?  ?General Comments: alert and oriented x4, mildly impulsive with vcs required for safety with RW ?  ?  ?General Comments  vss throughout ? ?  ?Exercises   ?  ?Shoulder Instructions    ? ? ?Home Living Family/patient expects to be discharged to:: Private residence ?Living Arrangements: Alone ?Available Help at Discharge: Family ?  ?Home Access: Stairs to enter ?Entrance Stairs-Number of Steps: 3 ?  ?Home Layout: One level ?  ?  ?Bathroom Shower/Tub: Walk-in shower ?  ?Bathroom Toilet: Standard ?  ?  ?Home Equipment: None ?  ?  ?  ? ?  ?Prior Functioning/Environment Prior Level of Function : Working/employed;Independent/Modified Independent ?  ?  ?  ?  ?  ?  ?Mobility Comments: independent without AE ?ADLs Comments: independent, employed as a Development worker, community ?  ? ?  ?  ?OT Problem List: Decreased activity tolerance;Decreased knowledge of use of DME or AE;Decreased knowledge of precautions ?  ?   ?OT Treatment/Interventions: Self-care/ADL training;Therapeutic  exercise;Patient/family education;DME and/or AE instruction;Therapeutic activities  ?  ?OT Goals(Current goals can be found in the care plan section) Acute Rehab OT Goals ?Patient Stated Goal: go home ?OT Goal Formulation: With patient ?Time For Goal Achievement: 11/18/21 ?Potential to Achieve Goals: Good ?ADL Goals ?Pt Will Perform Grooming: with modified independence ?Pt Will Perform Lower Body Dressing: with modified independence;with adaptive equipment ?Pt Will Transfer to Toilet: with modified independence ?Pt Will Perform Toileting - Clothing Manipulation and hygiene: with modified independence  ?OT Frequency: Min 2X/week ?  ? ?Co-evaluation   ?  ?  ?  ?  ? ?  ?AM-PAC OT "6 Clicks" Daily Activity     ?Outcome Measure Help from another person eating meals?: None ?Help from another person taking care of personal grooming?: None ?Help from another person toileting, which includes using toliet, bedpan, or urinal?: None ?Help from another person bathing (including washing, rinsing, drying)?: None ?Help from another person to put on and taking off regular upper body clothing?: None ?Help from another person to put on and taking off regular lower body clothing?: A Little ?6 Click Score: 23 ?  ?End of Session Equipment Utilized During Treatment: Rolling walker (2 wheels) ?Nurse Communication: Mobility status ? ?Activity Tolerance: Patient tolerated treatment well ?Patient left: in chair;with call bell/phone within reach;with chair alarm set ? ?OT Visit Diagnosis:  Unsteadiness on feet (R26.81)  ?              ?Time: 6384-5364 ?OT Time Calculation (min): 17 min ?Charges:  OT General Charges ?$OT Visit: 1 Visit ?OT Evaluation ?$OT Eval Low Complexity: 1 Low ?OT Treatments ?$Self Care/Home Management : 8-22 mins ? ?Shanon Payor, OTD OTR/L  ?11/03/21, 9:22 AM  ?

## 2021-11-04 LAB — SURGICAL PATHOLOGY

## 2022-08-10 ENCOUNTER — Other Ambulatory Visit: Payer: Self-pay | Admitting: Surgery

## 2022-08-22 ENCOUNTER — Encounter
Admission: RE | Admit: 2022-08-22 | Discharge: 2022-08-22 | Disposition: A | Discharge status: Home / Self Care | Payer: 59 | Source: Ambulatory Visit | Attending: Surgery | Admitting: Surgery

## 2022-08-22 ENCOUNTER — Telehealth: Payer: Self-pay | Admitting: Urgent Care

## 2022-08-22 VITALS — BP 122/71 | HR 69 | Temp 98.0°F | Resp 14 | Ht 73.0 in | Wt 147.0 lb

## 2022-08-22 DIAGNOSIS — Z01812 Encounter for preprocedural laboratory examination: Secondary | ICD-10-CM

## 2022-08-22 DIAGNOSIS — E876 Hypokalemia: Secondary | ICD-10-CM | POA: Diagnosis not present

## 2022-08-22 DIAGNOSIS — Z01818 Encounter for other preprocedural examination: Secondary | ICD-10-CM | POA: Insufficient documentation

## 2022-08-22 LAB — CBC WITH DIFFERENTIAL/PLATELET
Abs Immature Granulocytes: 0.08 10*3/uL — ABNORMAL HIGH (ref 0.00–0.07)
Basophils Absolute: 0.1 10*3/uL (ref 0.0–0.1)
Basophils Relative: 1 %
Eosinophils Absolute: 0.2 10*3/uL (ref 0.0–0.5)
Eosinophils Relative: 2 %
HCT: 44.7 % (ref 39.0–52.0)
Hemoglobin: 14.7 g/dL (ref 13.0–17.0)
Immature Granulocytes: 1 %
Lymphocytes Relative: 22 %
Lymphs Abs: 2.1 10*3/uL (ref 0.7–4.0)
MCH: 31.8 pg (ref 26.0–34.0)
MCHC: 32.9 g/dL (ref 30.0–36.0)
MCV: 96.8 fL (ref 80.0–100.0)
Monocytes Absolute: 0.6 10*3/uL (ref 0.1–1.0)
Monocytes Relative: 6 %
Neutro Abs: 6.3 10*3/uL (ref 1.7–7.7)
Neutrophils Relative %: 68 %
Platelets: 300 10*3/uL (ref 150–400)
RBC: 4.62 MIL/uL (ref 4.22–5.81)
RDW: 13.2 % (ref 11.5–15.5)
WBC: 9.3 10*3/uL (ref 4.0–10.5)
nRBC: 0 % (ref 0.0–0.2)

## 2022-08-22 LAB — COMPREHENSIVE METABOLIC PANEL
ALT: 14 U/L (ref 0–44)
AST: 17 U/L (ref 15–41)
Albumin: 3.9 g/dL (ref 3.5–5.0)
Alkaline Phosphatase: 76 U/L (ref 38–126)
Anion gap: 9 (ref 5–15)
BUN: 9 mg/dL (ref 8–23)
CO2: 24 mmol/L (ref 22–32)
Calcium: 9.1 mg/dL (ref 8.9–10.3)
Chloride: 106 mmol/L (ref 98–111)
Creatinine, Ser: 0.77 mg/dL (ref 0.61–1.24)
GFR, Estimated: >60 mL/min (ref >60)
Glucose, Bld: 100 mg/dL — ABNORMAL HIGH (ref 70–99)
Potassium: 2.9 mmol/L — ABNORMAL LOW (ref 3.5–5.1)
Sodium: 139 mmol/L (ref 135–145)
Total Bilirubin: 0.7 mg/dL (ref 0.3–1.2)
Total Protein: 7.2 g/dL (ref 6.5–8.1)

## 2022-08-22 LAB — URINALYSIS, ROUTINE W REFLEX MICROSCOPIC
Bilirubin Urine: NEGATIVE
Glucose, UA: NEGATIVE mg/dL
Ketones, ur: NEGATIVE mg/dL
Leukocytes,Ua: NEGATIVE
Nitrite: NEGATIVE
Protein, ur: NEGATIVE mg/dL
Specific Gravity, Urine: 1.017 (ref 1.005–1.030)
Squamous Epithelial / HPF: NONE SEEN /HPF (ref 0–5)
pH: 5 (ref 5.0–8.0)

## 2022-08-22 LAB — SURGICAL PCR SCREEN
MRSA, PCR: NEGATIVE
Staphylococcus aureus: NEGATIVE

## 2022-08-22 LAB — TYPE AND SCREEN
ABO/RH(D): A POS
Antibody Screen: NEGATIVE

## 2022-08-22 MED ORDER — POTASSIUM CHLORIDE CRYS ER 20 MEQ PO TBCR: EXTENDED_RELEASE_TABLET | ORAL | 0 refills | Status: DC

## 2022-08-22 NOTE — Patient Instructions (Addendum)
Your procedure is scheduled on: Thursday, January 18 Report to the Registration Desk on the 1st floor of the [facility]. To find out your arrival time, please call [phone] between 1PM - 3PM on: Wednesday, January 17 If your arrival time is 6:00 am, do not arrive prior to that time as the [facility] entrance doors do not open until 6:00 am.  REMEMBER: Instructions that are not followed completely may result in serious medical risk, up to and including death; or upon the discretion of your surgeon and anesthesiologist your surgery may need to be rescheduled.  Do not eat food after midnight the night before surgery.  No gum chewing, lozengers or hard candies.  You may however, drink CLEAR liquids up to 2 hours before you are scheduled to arrive for your surgery. Do not drink anything within 2 hours of your scheduled arrival time.  Clear liquids include: - water  - apple juice without pulp - gatorade (not RED colors) - black coffee or tea (Do NOT add milk or creamers to the coffee or tea) Do NOT drink anything that is not on this list.  In addition, your doctor has ordered for you to drink the provided  Ensure Pre-Surgery Clear Carbohydrate Drink  Drinking this carbohydrate drink up to two hours before surgery helps to reduce insulin resistance and improve patient outcomes. Please complete drinking 2 hours prior to scheduled arrival time.  TAKE THESE MEDICATIONS THE MORNING OF SURGERY:  Flovent inhaler  Use inhaler on the day of surgery and bring your albuterol inhaler to the hospital.  One week prior to surgery: starting January 11 Stop Anti-inflammatories (NSAIDS) such as Advil, Aleve, Ibuprofen, Motrin, Naproxen, Naprosyn and Aspirin based products such as Excedrin, Goodys Powder, BC Powder. Stop ANY OVER THE COUNTER supplements until after surgery. You may however, continue to take Tylenol if needed for pain up until the day of surgery.  No Alcohol for 24 hours before or  after surgery.  No Smoking including e-cigarettes for 24 hours prior to surgery.  No chewable tobacco products for at least 6 hours prior to surgery.  No nicotine patches on the day of surgery.  Do not use any "recreational" drugs for at least a week prior to your surgery.  Please be advised that the combination of cocaine and anesthesia may have negative outcomes, up to and including death. If you test positive for cocaine, your surgery will be cancelled.  On the morning of surgery brush your teeth with toothpaste and water, you may rinse your mouth with mouthwash if you wish. Do not swallow any toothpaste or mouthwash.  Use CHG Soap as directed on instruction sheet.  Do not wear jewelry.  Do not wear lotions, powders, or perfumes.   Do not shave body from the neck down 48 hours prior to surgery just in case you cut yourself which could leave a site for infection.  Also, freshly shaved skin may become irritated if using the CHG soap.  Contact lenses, hearing aids and dentures may not be worn into surgery.  Do not bring valuables to the hospital. [facility] is not responsible for any missing/lost belongings or valuables.   Notify your doctor if there is any change in your medical condition (cold, fever, infection).  Wear comfortable clothing (specific to your surgery type) to the hospital.  After surgery, you can help prevent lung complications by doing breathing exercises.  Take deep breaths and cough every 1-2 hours. Your doctor may order a device  called an Chiropodist to help you take deep breaths.  If you are being admitted to the hospital overnight, leave your suitcase in the car. After surgery it may be brought to your room.  If you are being discharged the day of surgery, you will not be allowed to drive home. You will need a responsible adult (18 years or older) to drive you home and stay with you that night.   If you are taking public transportation, you will  need to have a responsible adult (18 years or older) with you. Please confirm with your physician that it is acceptable to use public transportation.   Please call the [facility] Dept. at [phone] if you have any questions about these instructions.  Surgery Visitation Policy:  Patients undergoing a surgery or procedure may have two family members or support persons with them as long as the person is not COVID-19 positive or experiencing its symptoms.   Inpatient Visitation:    Visiting hours are 7 a.m. to 8 p.m. Up to four visitors are allowed at one time in a patient room. The visitors may rotate out with other people during the day. One designated support person (adult) may remain overnight.  Due to an increase in RSV and influenza rates and associated hospitalizations, children ages 54 and under will not be able to visit patients in [facility]. Masks continue to be strongly recommended.     Preparing for Surgery with CHLORHEXIDINE GLUCONATE (CHG) Soap  Chlorhexidine Gluconate (CHG) Soap  o An antiseptic cleaner that kills germs and bonds with the skin to continue killing germs even after washing  o Used for showering the night before surgery and morning of surgery  Before surgery, you can play an important role by reducing the number of germs on your skin.  CHG (Chlorhexidine gluconate) soap is an antiseptic cleanser which kills germs and bonds with the skin to continue killing germs even after washing.  Please do not use if you have an allergy to CHG or antibacterial soaps. If your skin becomes reddened/irritated stop using the CHG.  1. Shower the NIGHT BEFORE SURGERY and the MORNING OF SURGERY with CHG soap.  2. If you choose to wash your hair, wash your hair first as usual with your normal shampoo.  3. After shampooing, rinse your hair and body thoroughly to remove the shampoo.  4. Use CHG as you would any other liquid soap. You can apply CHG  directly to the skin and wash gently with a scrungie or a clean washcloth.  5. Apply the CHG soap to your body only from the neck down. Do not use on open wounds or open sores. Avoid contact with your eyes, ears, mouth, and genitals (private parts). Wash face and genitals (private parts) with your normal soap.  6. Wash thoroughly, paying special attention to the area where your surgery will be performed.  7. Thoroughly rinse your body with warm water.  8. Do not shower/wash with your normal soap after using and rinsing off the CHG soap.  9. Pat yourself dry with a clean towel.  10. Wear clean pajamas to bed the night before surgery.  12. Place clean sheets on your bed the night of your first shower and do not sleep with pets.  13. Shower again with the CHG soap on the day of surgery prior to arriving at the hospital.  14. Do not apply any deodorants/lotions/powders.  15. Please wear clean clothes to the hospital.

## 2022-08-22 NOTE — Progress Notes (Signed)
[facility] Perioperative Services: Pre-Admission/Anesthesia Testing  Abnormal Lab Notification and Treatment Plan of Care   Date: 08/22/22  Name: HENCE DERRICK MRN:   [id number]  Re: Abnormal labs noted during PAT appointment   Notified:  Provider Name Provider Role Notification Mode  Poggi, Jenny Reichmann, MD Orthopedics (Surgeon) Routed and/or faxed via Marisue Humble, Elyse Jarvis, MD Primary Care Provider Routed and/or faxed via [place] and Notes:  ABNORMAL LAB VALUE(S): Lab Results  Component Value Date   K 2.9 (L) 08/22/2022   Tommy Avila is scheduled for an elective LEFT TOTAL HIP ARTHROPLASTY on 09/01/2022. In review of his medication reconciliation, it is noted that the patient is not taking any type of prescribed diuretic medications. Looking over his history of labs, patient with a noted history of mild hypokalemia, with K+ ranging between 2.9-3.7 mmol/L. Renal function normal. Derangement likely nutritional, as patient is not really on any type of medications that would cause this degree of hypokalemia. With that being said, he is on albuterol, which could be contributory at least to some degree.   Please note, in efforts to promote a safe and effective anesthetic course, per current guidelines/standards set by the [facility] anesthesia team, the minimal acceptable K+ level for the patient to proceed with general anesthesia is 3.0 mmol/L. At his current level, the patient is already too low to undergo general anesthesia as planned. In efforts to prevent case cancellation, will make efforts to optimize pre-surgical K+ level so that patient can safely undergo the planned surgical intervention.   Impression and Plan:  Tommy Avila found to be HYPOkalemic at 2.9 mmol/L on preoperative labs. He is on not diuretic therapy or any daily K+ supplements. Attempted to contact patient to discuss results and plans for correction of noted electrolyte  derangement, however unabel to reach. Left detailed message on machine regarding following plan of care:  Meds ordered this encounter  Medications   potassium chloride SA (KLOR-CON M) 20 MEQ tablet    Sig: Take 2 tablets (40 mEq) today, then 1 tablet (20 mEq) daily until surgery. Be sure to take dose on day of surgery. Follow up with PCP for repeat labs.    Dispense:  12 tablet    Refill:  0   Encouraged patient to follow up with PCP about 2-3 weeks postoperatively to have labs rechecked to ensure that levels are remaining within normal range. Discussed nutritional intake of K+ rich foods as an adjunctive way to keep his K+ levels normal; list of K+ rich foods verbally provided. Also mentioned ORS, however advised him not to rely solely on these drinks, as they are high in Na+, and he has a HTN diagnosis.   Will send copy of this note to surgeon and PCP to make them aware of K+ level and plans for correction. Order entered to recheck K+ on the day of his surgery to ensure optimization.  Wished patient the best of luck with his upcoming surgery and subsequent recovery. He was encouraged to return call to the PAT clinic, or to his surgeon's office, should any questions or concerns arise between now and the time of his surgery.   Encounter Diagnoses  Name Primary?   Pre-operative laboratory examination Yes   Hypokalemia    Honor Loh, MSN, APRN, FNP-C, CEN [facility]  Peri-operative Services Nurse Practitioner Phone: [phone] 08/22/22 4:24 PM  NOTE: This note has been prepared using Dragon dictation software.  Despite my best ability to proofread, there is always the potential that unintentional transcriptional errors may still occur from this process.

## 2022-08-31 MED ORDER — ORAL CARE MOUTH RINSE: 15.0000 mL | Freq: Once | OROMUCOSAL | Status: AC

## 2022-08-31 MED ORDER — LACTATED RINGERS IV SOLN: INTRAVENOUS | Status: DC

## 2022-08-31 MED ORDER — CEFAZOLIN SODIUM-DEXTROSE 2-4 GM/100ML-% IV SOLN
2.0000 g | INTRAVENOUS | Status: AC
  Administered 2022-09-01: 2 g via INTRAVENOUS

## 2022-08-31 MED ORDER — CHLORHEXIDINE GLUCONATE 0.12 % MT SOLN: 15.0000 mL | Freq: Once | OROMUCOSAL | Status: AC

## 2022-08-31 MED ORDER — FAMOTIDINE 20 MG PO TABS: 20.0000 mg | ORAL_TABLET | Freq: Once | ORAL | Status: AC

## 2022-09-01 ENCOUNTER — Encounter: Payer: Self-pay | Admitting: Surgery

## 2022-09-01 ENCOUNTER — Ambulatory Visit: Payer: 59 | Admitting: Certified Registered"

## 2022-09-01 ENCOUNTER — Other Ambulatory Visit: Payer: Self-pay

## 2022-09-01 ENCOUNTER — Observation Stay
Admission: RE | Admit: 2022-09-01 | Discharge: 2022-09-02 | Disposition: A | Discharge status: Home Health | Payer: 59 | Attending: Surgery | Admitting: Surgery

## 2022-09-01 ENCOUNTER — Encounter
Admission: RE | Disposition: A | Discharge status: Home Health | Payer: Self-pay | Source: Home / Self Care | Attending: Surgery

## 2022-09-01 ENCOUNTER — Observation Stay: Payer: 59

## 2022-09-01 ENCOUNTER — Other Ambulatory Visit (HOSPITAL_COMMUNITY): Payer: Self-pay

## 2022-09-01 ENCOUNTER — Ambulatory Visit: Payer: 59 | Admitting: Urgent Care

## 2022-09-01 DIAGNOSIS — Z7982 Long term (current) use of aspirin: Secondary | ICD-10-CM | POA: Insufficient documentation

## 2022-09-01 DIAGNOSIS — M1612 Unilateral primary osteoarthritis, left hip: Secondary | ICD-10-CM | POA: Diagnosis present

## 2022-09-01 DIAGNOSIS — Z96641 Presence of right artificial hip joint: Secondary | ICD-10-CM | POA: Diagnosis not present

## 2022-09-01 DIAGNOSIS — E876 Hypokalemia: Secondary | ICD-10-CM

## 2022-09-01 DIAGNOSIS — Z96642 Presence of left artificial hip joint: Secondary | ICD-10-CM

## 2022-09-01 DIAGNOSIS — Z8616 Personal history of COVID-19: Secondary | ICD-10-CM | POA: Insufficient documentation

## 2022-09-01 DIAGNOSIS — Z01812 Encounter for preprocedural laboratory examination: Secondary | ICD-10-CM

## 2022-09-01 DIAGNOSIS — Z79899 Other long term (current) drug therapy: Secondary | ICD-10-CM | POA: Diagnosis not present

## 2022-09-01 DIAGNOSIS — J449 Chronic obstructive pulmonary disease, unspecified: Secondary | ICD-10-CM | POA: Insufficient documentation

## 2022-09-01 HISTORY — PX: TOTAL HIP ARTHROPLASTY: SHX124

## 2022-09-01 LAB — POCT I-STAT, CHEM 8
BUN: 8 mg/dL (ref 8–23)
Calcium, Ion: 1.21 mmol/L (ref 1.15–1.40)
Chloride: 106 mmol/L (ref 98–111)
Creatinine, Ser: 0.8 mg/dL (ref 0.61–1.24)
Glucose, Bld: 73 mg/dL (ref 70–99)
HCT: 43 % (ref 39.0–52.0)
Hemoglobin: 14.6 g/dL (ref 13.0–17.0)
Potassium: 3.5 mmol/L (ref 3.5–5.1)
Sodium: 142 mmol/L (ref 135–145)
TCO2: 23 mmol/L (ref 22–32)

## 2022-09-01 SURGERY — ARTHROPLASTY, HIP, TOTAL,POSTERIOR APPROACH
Anesthesia: General | Site: Hip | Laterality: Left

## 2022-09-01 MED ORDER — EPHEDRINE SULFATE (PRESSORS) 50 MG/ML IJ SOLN
INTRAMUSCULAR | Status: DC | PRN
  Administered 2022-09-01 (×5): 5 mg via INTRAVENOUS

## 2022-09-01 MED ORDER — CEFAZOLIN SODIUM-DEXTROSE 2-4 GM/100ML-% IV SOLN
INTRAVENOUS | Status: AC
  Filled 2022-09-01: qty 100

## 2022-09-01 MED ORDER — ACETAMINOPHEN 325 MG PO TABS: 325.0000 mg | ORAL_TABLET | Freq: Four times a day (QID) | ORAL | Status: DC | PRN

## 2022-09-01 MED ORDER — ALBUTEROL SULFATE (2.5 MG/3ML) 0.083% IN NEBU: 3.0000 mL | INHALATION_SOLUTION | Freq: Four times a day (QID) | RESPIRATORY_TRACT | Status: DC | PRN

## 2022-09-01 MED ORDER — LACTATED RINGERS IV SOLN: INTRAVENOUS | Status: DC | PRN

## 2022-09-01 MED ORDER — FENTANYL CITRATE (PF) 100 MCG/2ML IJ SOLN
INTRAMUSCULAR | Status: AC
  Filled 2022-09-01: qty 2

## 2022-09-01 MED ORDER — LIDOCAINE HCL (CARDIAC) PF 100 MG/5ML IV SOSY
PREFILLED_SYRINGE | INTRAVENOUS | Status: DC | PRN
  Administered 2022-09-01: 60 mg via INTRAVENOUS

## 2022-09-01 MED ORDER — TRANEXAMIC ACID 1000 MG/10ML IV SOLN
INTRAVENOUS | Status: AC
  Filled 2022-09-01: qty 10

## 2022-09-01 MED ORDER — MIDAZOLAM HCL 2 MG/2ML IJ SOLN
INTRAMUSCULAR | Status: AC
  Filled 2022-09-01: qty 2

## 2022-09-01 MED ORDER — KETOROLAC TROMETHAMINE 30 MG/ML IJ SOLN
INTRAMUSCULAR | Status: DC | PRN
  Administered 2022-09-01: 30 mg via INTRAMUSCULAR

## 2022-09-01 MED ORDER — CALCIUM CARBONATE 1250 (500 CA) MG PO TABS
1.0000 | ORAL_TABLET | Freq: Every day | ORAL | Status: DC
  Administered 2022-09-01: 1250 mg via ORAL
  Filled 2022-09-01: qty 1

## 2022-09-01 MED ORDER — ONDANSETRON HCL 4 MG/2ML IJ SOLN
INTRAMUSCULAR | Status: DC | PRN
  Administered 2022-09-01: 4 mg via INTRAVENOUS

## 2022-09-01 MED ORDER — ORAL CARE MOUTH RINSE: 15.0000 mL | OROMUCOSAL | Status: DC | PRN

## 2022-09-01 MED ORDER — PROPOFOL 10 MG/ML IV BOLUS
INTRAVENOUS | Status: AC
  Filled 2022-09-01: qty 20

## 2022-09-01 MED ORDER — KETOROLAC TROMETHAMINE 15 MG/ML IJ SOLN
15.0000 mg | Freq: Once | INTRAMUSCULAR | Status: AC
  Administered 2022-09-01: 15 mg via INTRAVENOUS

## 2022-09-01 MED ORDER — HYDROCODONE-ACETAMINOPHEN 5-325 MG PO TABS
1.0000 | ORAL_TABLET | ORAL | Status: DC | PRN
  Filled 2022-09-01: qty 2

## 2022-09-01 MED ORDER — BUDESONIDE 0.25 MG/2ML IN SUSP
0.2500 mg | Freq: Two times a day (BID) | RESPIRATORY_TRACT | Status: DC
  Administered 2022-09-01 – 2022-09-02 (×2): 0.25 mg via RESPIRATORY_TRACT
  Filled 2022-09-01 (×2): qty 2

## 2022-09-01 MED ORDER — ROCURONIUM BROMIDE 100 MG/10ML IV SOLN
INTRAVENOUS | Status: DC | PRN
  Administered 2022-09-01: 50 mg via INTRAVENOUS
  Administered 2022-09-01: 20 mg via INTRAVENOUS

## 2022-09-01 MED ORDER — KETOROLAC TROMETHAMINE 15 MG/ML IJ SOLN
7.5000 mg | Freq: Four times a day (QID) | INTRAMUSCULAR | Status: DC
  Administered 2022-09-01 – 2022-09-02 (×3): 7.5 mg via INTRAVENOUS
  Filled 2022-09-01 (×4): qty 1

## 2022-09-01 MED ORDER — FLEET ENEMA 7-19 GM/118ML RE ENEM: 1.0000 | ENEMA | Freq: Once | RECTAL | Status: DC | PRN

## 2022-09-01 MED ORDER — SODIUM CHLORIDE 0.9 % IR SOLN
Status: DC | PRN
  Administered 2022-09-01: 3000 mL

## 2022-09-01 MED ORDER — APIXABAN 2.5 MG PO TABS
2.5000 mg | ORAL_TABLET | Freq: Two times a day (BID) | ORAL | Status: DC
  Administered 2022-09-02: 2.5 mg via ORAL
  Filled 2022-09-01: qty 1

## 2022-09-01 MED ORDER — HYDROMORPHONE HCL 1 MG/ML IJ SOLN
INTRAMUSCULAR | Status: AC
  Filled 2022-09-01: qty 1

## 2022-09-01 MED ORDER — CHLORHEXIDINE GLUCONATE 0.12 % MT SOLN
OROMUCOSAL | Status: AC
  Administered 2022-09-01: 15 mL via OROMUCOSAL
  Filled 2022-09-01: qty 15

## 2022-09-01 MED ORDER — POTASSIUM CHLORIDE CRYS ER 20 MEQ PO TBCR
20.0000 meq | EXTENDED_RELEASE_TABLET | Freq: Every day | ORAL | Status: DC
  Administered 2022-09-01: 20 meq via ORAL
  Filled 2022-09-01 (×2): qty 1

## 2022-09-01 MED ORDER — FENTANYL CITRATE (PF) 100 MCG/2ML IJ SOLN
25.0000 ug | INTRAMUSCULAR | Status: DC | PRN
  Administered 2022-09-01 (×2): 25 ug via INTRAVENOUS

## 2022-09-01 MED ORDER — HYDROMORPHONE HCL 1 MG/ML IJ SOLN
INTRAMUSCULAR | Status: DC | PRN
  Administered 2022-09-01: .2 mg via INTRAVENOUS
  Administered 2022-09-01: .3 mg via INTRAVENOUS
  Administered 2022-09-01: .2 mg via INTRAVENOUS

## 2022-09-01 MED ORDER — 0.9 % SODIUM CHLORIDE (POUR BTL) OPTIME
TOPICAL | Status: DC | PRN
  Administered 2022-09-01: 500 mL

## 2022-09-01 MED ORDER — POTASSIUM CHLORIDE IN NACL 20-0.9 MEQ/L-% IV SOLN
INTRAVENOUS | Status: DC
  Filled 2022-09-01 (×3): qty 1000

## 2022-09-01 MED ORDER — PHENYLEPHRINE HCL (PRESSORS) 10 MG/ML IV SOLN
INTRAVENOUS | Status: AC
  Filled 2022-09-01: qty 1

## 2022-09-01 MED ORDER — KETOROLAC TROMETHAMINE 15 MG/ML IJ SOLN
INTRAMUSCULAR | Status: AC
  Filled 2022-09-01: qty 1

## 2022-09-01 MED ORDER — DEXAMETHASONE SODIUM PHOSPHATE 10 MG/ML IJ SOLN
INTRAMUSCULAR | Status: DC | PRN
  Administered 2022-09-01: 5 mg via INTRAVENOUS

## 2022-09-01 MED ORDER — TRIAMCINOLONE ACETONIDE 40 MG/ML IJ SUSP
INTRAMUSCULAR | Status: AC
  Filled 2022-09-01: qty 1

## 2022-09-01 MED ORDER — BUPIVACAINE LIPOSOME 1.3 % IJ SUSP
INTRAMUSCULAR | Status: AC
  Filled 2022-09-01: qty 20

## 2022-09-01 MED ORDER — PHENYLEPHRINE HCL (PRESSORS) 10 MG/ML IV SOLN
INTRAVENOUS | Status: DC | PRN
  Administered 2022-09-01 (×5): 80 ug via INTRAVENOUS
  Administered 2022-09-01 (×2): 160 ug via INTRAVENOUS

## 2022-09-01 MED ORDER — BISACODYL 10 MG RE SUPP: 10.0000 mg | Freq: Every day | RECTAL | Status: DC | PRN

## 2022-09-01 MED ORDER — ACETAMINOPHEN 10 MG/ML IV SOLN
INTRAVENOUS | Status: DC | PRN
  Administered 2022-09-01: 1000 mg via INTRAVENOUS

## 2022-09-01 MED ORDER — ONDANSETRON HCL 4 MG PO TABS: 4.0000 mg | ORAL_TABLET | Freq: Four times a day (QID) | ORAL | Status: DC | PRN

## 2022-09-01 MED ORDER — TRANEXAMIC ACID 1000 MG/10ML IV SOLN
INTRAVENOUS | Status: DC | PRN
  Administered 2022-09-01: 1000 mg via TOPICAL

## 2022-09-01 MED ORDER — MIDAZOLAM HCL 2 MG/2ML IJ SOLN
INTRAMUSCULAR | Status: DC | PRN
  Administered 2022-09-01: 1 mg via INTRAVENOUS

## 2022-09-01 MED ORDER — SODIUM CHLORIDE FLUSH 0.9 % IV SOLN
INTRAVENOUS | Status: AC
  Filled 2022-09-01: qty 10

## 2022-09-01 MED ORDER — TRIAMCINOLONE ACETONIDE 40 MG/ML IJ SUSP
INTRAMUSCULAR | Status: DC | PRN
  Administered 2022-09-01: 80 mg via INTRAMUSCULAR

## 2022-09-01 MED ORDER — FENTANYL CITRATE (PF) 100 MCG/2ML IJ SOLN
INTRAMUSCULAR | Status: DC | PRN
  Administered 2022-09-01: 100 ug via INTRAVENOUS

## 2022-09-01 MED ORDER — PHENYLEPHRINE HCL-NACL 20-0.9 MG/250ML-% IV SOLN
INTRAVENOUS | Status: AC
  Filled 2022-09-01: qty 250

## 2022-09-01 MED ORDER — ACETAMINOPHEN 10 MG/ML IV SOLN
INTRAVENOUS | Status: AC
  Filled 2022-09-01: qty 100

## 2022-09-01 MED ORDER — CEFAZOLIN SODIUM-DEXTROSE 2-4 GM/100ML-% IV SOLN
2.0000 g | Freq: Four times a day (QID) | INTRAVENOUS | Status: AC
  Administered 2022-09-01 – 2022-09-02 (×3): 2 g via INTRAVENOUS
  Filled 2022-09-01 (×3): qty 100

## 2022-09-01 MED ORDER — BUPIVACAINE-EPINEPHRINE (PF) 0.5% -1:200000 IJ SOLN
INTRAMUSCULAR | Status: DC | PRN
  Administered 2022-09-01: 30 mL

## 2022-09-01 MED ORDER — PROPOFOL 10 MG/ML IV BOLUS
INTRAVENOUS | Status: DC | PRN
  Administered 2022-09-01: 50 mg via INTRAVENOUS
  Administered 2022-09-01: 30 mg via INTRAVENOUS
  Administered 2022-09-01: 120 mg via INTRAVENOUS

## 2022-09-01 MED ORDER — PROPOFOL 1000 MG/100ML IV EMUL
INTRAVENOUS | Status: AC
  Filled 2022-09-01: qty 100

## 2022-09-01 MED ORDER — DIPHENHYDRAMINE HCL 12.5 MG/5ML PO ELIX: 12.5000 mg | ORAL_SOLUTION | ORAL | Status: DC | PRN

## 2022-09-01 MED ORDER — SUGAMMADEX SODIUM 200 MG/2ML IV SOLN
INTRAVENOUS | Status: DC | PRN
  Administered 2022-09-01: 200 mg via INTRAVENOUS

## 2022-09-01 MED ORDER — METOCLOPRAMIDE HCL 5 MG/ML IJ SOLN: 5.0000 mg | Freq: Three times a day (TID) | INTRAMUSCULAR | Status: DC | PRN

## 2022-09-01 MED ORDER — ONDANSETRON HCL 4 MG/2ML IJ SOLN: 4.0000 mg | Freq: Four times a day (QID) | INTRAMUSCULAR | Status: DC | PRN

## 2022-09-01 MED ORDER — SODIUM CHLORIDE 0.9 % IV SOLN
INTRAVENOUS | Status: DC | PRN
  Administered 2022-09-01: 60 mL

## 2022-09-01 MED ORDER — MAGNESIUM HYDROXIDE 400 MG/5ML PO SUSP: 30.0000 mL | Freq: Every day | ORAL | Status: DC | PRN

## 2022-09-01 MED ORDER — ACETAMINOPHEN 500 MG PO TABS
500.0000 mg | ORAL_TABLET | Freq: Four times a day (QID) | ORAL | Status: AC
  Administered 2022-09-01 – 2022-09-02 (×4): 500 mg via ORAL
  Filled 2022-09-01 (×4): qty 1

## 2022-09-01 MED ORDER — DOCUSATE SODIUM 100 MG PO CAPS
100.0000 mg | ORAL_CAPSULE | Freq: Two times a day (BID) | ORAL | Status: DC
  Administered 2022-09-02: 100 mg via ORAL
  Filled 2022-09-01 (×2): qty 1

## 2022-09-01 MED ORDER — FAMOTIDINE 20 MG PO TABS
ORAL_TABLET | ORAL | Status: AC
  Administered 2022-09-01: 20 mg via ORAL
  Filled 2022-09-01: qty 1

## 2022-09-01 MED ORDER — MORPHINE SULFATE (PF) 2 MG/ML IV SOLN
1.0000 mg | INTRAVENOUS | Status: DC | PRN
  Administered 2022-09-01 (×2): 2 mg via INTRAVENOUS
  Filled 2022-09-01 (×2): qty 1

## 2022-09-01 MED ORDER — METOCLOPRAMIDE HCL 10 MG PO TABS: 5.0000 mg | ORAL_TABLET | Freq: Three times a day (TID) | ORAL | Status: DC | PRN

## 2022-09-01 SURGICAL SUPPLY — 56 items
ACE SHELL 54 4H HIP (Shell) ×1 IMPLANT
BLADE SAGITTAL WIDE XTHICK NO (BLADE) ×1 IMPLANT
BLADE SURG SZ20 CARB STEEL (BLADE) ×1 IMPLANT
CHLORAPREP W/TINT 26 (MISCELLANEOUS) ×1 IMPLANT
DRAPE 3/4 80X56 (DRAPES) ×1 IMPLANT
DRAPE IMP U-DRAPE 54X76 (DRAPES) IMPLANT
DRAPE INCISE IOBAN 66X60 STRL (DRAPES) ×1 IMPLANT
DRAPE SURG 17X11 SM STRL (DRAPES) ×2 IMPLANT
DRSG MEPILEX SACRM 8.7X9.8 (GAUZE/BANDAGES/DRESSINGS) ×1 IMPLANT
DRSG OPSITE POSTOP 4X10 (GAUZE/BANDAGES/DRESSINGS) ×1 IMPLANT
ELECT CAUTERY BLADE 6.4 (BLADE) ×1 IMPLANT
GAUZE 4X4 16PLY ~~LOC~~+RFID DBL (SPONGE) ×1 IMPLANT
GAUZE XEROFORM 1X8 LF (GAUZE/BANDAGES/DRESSINGS) ×1 IMPLANT
GLOVE BIO SURGEON STRL SZ7.5 (GLOVE) ×4 IMPLANT
GLOVE BIO SURGEON STRL SZ8 (GLOVE) ×4 IMPLANT
GLOVE BIOGEL PI IND STRL 8 (GLOVE) ×1 IMPLANT
GLOVE SURG SYN 8.0 (GLOVE) ×1 IMPLANT
GLOVE SURG SYN 8.0 PF PI (GLOVE) IMPLANT
GLOVE SURG UNDER LTX SZ8 (GLOVE) ×1 IMPLANT
GOWN STRL REUS W/ TWL LRG LVL3 (GOWN DISPOSABLE) ×1 IMPLANT
GOWN STRL REUS W/ TWL XL LVL3 (GOWN DISPOSABLE) ×1 IMPLANT
GOWN STRL REUS W/TWL LRG LVL3 (GOWN DISPOSABLE) ×1
GOWN STRL REUS W/TWL XL LVL3 (GOWN DISPOSABLE) ×1
HANDLE YANKAUER SUCT OPEN TIP (MISCELLANEOUS) ×1 IMPLANT
HEAD CERAMIC BIOLOX 36 (Head) IMPLANT
HOLSTER ELECTROSUGICAL PENCIL (MISCELLANEOUS) ×1 IMPLANT
HOOD PEEL AWAY T7 (MISCELLANEOUS) ×3 IMPLANT
IV NS IRRIG 3000ML ARTHROMATIC (IV SOLUTION) ×1 IMPLANT
KIT TURNOVER KIT A (KITS) ×1 IMPLANT
LINER ACE G7 36 SZF HIGH WALL (Liner) IMPLANT
MANIFOLD NEPTUNE II (INSTRUMENTS) ×1 IMPLANT
NDL FILTER BLUNT 18X1 1/2 (NEEDLE) ×1 IMPLANT
NDL SAFETY ECLIP 18X1.5 (MISCELLANEOUS) ×2 IMPLANT
NDL SPNL 20GX3.5 QUINCKE YW (NEEDLE) ×1 IMPLANT
NEEDLE FILTER BLUNT 18X1 1/2 (NEEDLE) ×1 IMPLANT
NEEDLE SPNL 20GX3.5 QUINCKE YW (NEEDLE) ×1 IMPLANT
PACK HIP PROSTHESIS (MISCELLANEOUS) ×1 IMPLANT
PENCIL SMOKE EVACUATOR (MISCELLANEOUS) ×1 IMPLANT
PIN STEINMAN 3/16 (PIN) ×1 IMPLANT
PULSAVAC PLUS IRRIG FAN TIP (DISPOSABLE) ×1
SHELL ACETAB 54 4H HIP (Shell) IMPLANT
SPONGE T-LAP 18X18 ~~LOC~~+RFID (SPONGE) ×5 IMPLANT
STAPLER SKIN PROX 35W (STAPLE) ×1 IMPLANT
STEM COLLARLESS FULL 13X145MM (Stem) IMPLANT
SUT TICRON 2-0 30IN 311381 (SUTURE) ×3 IMPLANT
SUT VIC AB 0 CT1 36 (SUTURE) ×1 IMPLANT
SUT VIC AB 1 CT1 36 (SUTURE) ×1 IMPLANT
SUT VIC AB 2-0 CT1 (SUTURE) ×3 IMPLANT
SYR 10ML LL (SYRINGE) ×1 IMPLANT
SYR 20ML LL LF (SYRINGE) ×1 IMPLANT
SYR 30ML LL (SYRINGE) ×2 IMPLANT
TAPE TRANSPORE STRL 2 31045 (GAUZE/BANDAGES/DRESSINGS) ×1 IMPLANT
TIP FAN IRRIG PULSAVAC PLUS (DISPOSABLE) ×1 IMPLANT
TRAP FLUID SMOKE EVACUATOR (MISCELLANEOUS) ×2 IMPLANT
WATER STERILE IRR 1000ML POUR (IV SOLUTION) ×1 IMPLANT
WATER STERILE IRR 500ML POUR (IV SOLUTION) ×1 IMPLANT

## 2022-09-01 NOTE — Anesthesia Preprocedure Evaluation (Signed)
Anesthesia Evaluation  Patient identified by MRN, date of birth, ID band Patient awake    Reviewed: Allergy & Precautions, NPO status , Patient's Chart, lab work & pertinent test results  History of Anesthesia Complications Negative for: history of anesthetic complications  Airway Mallampati: III  TM Distance: >3 FB Neck ROM: full    Dental  (+) Missing   Pulmonary shortness of breath, COPD, Current Smoker   Pulmonary exam normal        Cardiovascular Exercise Tolerance: Good (-) angina (-) Past MI negative cardio ROS Normal cardiovascular exam     Neuro/Psych negative neurological ROS  negative psych ROS   GI/Hepatic negative GI ROS, Neg liver ROS,neg GERD  ,,  Endo/Other  negative endocrine ROS    Renal/GU      Musculoskeletal   Abdominal   Peds  Hematology negative hematology ROS (+)   Anesthesia Other Findings Past Medical History: No date: Arthritis No date: COPD (chronic obstructive pulmonary disease) (HCC) No date: Dyspnea 10/13/2021: History of 2019 novel coronavirus disease (COVID-19)     Comment:  a.) tested (+) via home test. 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, left  Past Surgical History: No date: BACK SURGERY     Comment:  lumbar bulging disc 06/15/2021: COLONOSCOPY WITH PROPOFOL; N/A     Comment:  Procedure: COLONOSCOPY WITH PROPOFOL;  Surgeon: [name], MD;  Location: [facility] ENDOSCOPY;  Service:               Endoscopy;  Laterality: N/A; No date: GANGLION CYST EXCISION     Comment:  wrist 05/22/2019: INGUINAL HERNIA REPAIR; Left     Comment:  Procedure: HERNIA REPAIR INGUINAL ADULT;  Surgeon:               Fredirick Maudlin, MD;  Location: [facility] ORS;  Service:               General;  Laterality: Left; No date: SHOULDER SURGERY; Left     Comment:  x5 11/02/2021: TOTAL HIP  ARTHROPLASTY; Right     Comment:  Procedure: TOTAL HIP ARTHROPLASTY;  Surgeon: Corky Mull, MD;  Location: [facility] ORS;  Service: Orthopedics;                Laterality: Right; No date: TOTAL SHOULDER ARTHROPLASTY; Left  BMI    Body Mass Index: 19.39 kg/m      Reproductive/Obstetrics negative OB ROS                             Anesthesia Physical Anesthesia Plan  ASA: 3  Anesthesia Plan: General ETT   Post-op Pain Management:    Induction: Intravenous  PONV Risk Score and Plan: Ondansetron, Dexamethasone, Midazolam and Treatment may vary due to age or medical condition  Airway Management Planned: Oral ETT  Additional Equipment:   Intra-op Plan:   Post-operative Plan: Extubation in OR  Informed Consent: I have reviewed the patients History and Physical, chart, labs and discussed the procedure including the risks, benefits and alternatives for the proposed anesthesia with the patient or authorized representative who has indicated his/her understanding and acceptance.     Dental Advisory Given  Plan Discussed with: Anesthesiologist, CRNA  and Surgeon  Anesthesia Plan Comments: (Patient refuses spinal  Patient consented for risks of anesthesia including but not limited to:  - adverse reactions to medications - damage to eyes, teeth, lips or other oral mucosa - nerve damage due to positioning  - sore throat or hoarseness - Damage to heart, brain, nerves, lungs, other parts of body or loss of life  Patient voiced understanding.)       Anesthesia Quick Evaluation

## 2022-09-01 NOTE — Anesthesia Procedure Notes (Signed)
Procedure Name: Intubation Date/Time: 09/01/2022 7:53 AM  Performed by: Otho Perl, CRNAPre-anesthesia Checklist: Patient identified, Patient being monitored, Timeout performed, Emergency Drugs available and Suction available Patient Re-evaluated:Patient Re-evaluated prior to induction Oxygen Delivery Method: Circle system utilized Preoxygenation: Pre-oxygenation with 100% oxygen Induction Type: IV induction Ventilation: Mask ventilation without difficulty and Oral airway inserted - appropriate to patient size Laryngoscope Size: Mac and 3 Grade View: Grade I Tube type: Oral Tube size: 7.5 mm Number of attempts: 1 Airway Equipment and Method: Stylet Placement Confirmation: ETT inserted through vocal cords under direct vision, positive ETCO2 and breath sounds checked- equal and bilateral Secured at: 21 cm Tube secured with: Tape Dental Injury: Teeth and Oropharynx as per pre-operative assessment

## 2022-09-01 NOTE — Evaluation (Signed)
Physical Therapy Evaluation Patient Details Name: Tommy Avila MRN: [id number] DOB: Jan 14, 1951 Today's Date: 09/01/2022  History of Present Illness  Patient is a 72 year old male s/p L THA. PMH includes R THA in march and COPD, shoulder surgery, back surgery, smoker.  Clinical Impression  Patient received in bed, he is agreeable to PT session. Patient required mod A for supine to sit. He is able to stand with increased effort and min A. Patient is able to ambulate with RW 120 feet with min guard. He will continue to benefit from skilled PT to improve functional independence and safety with mobility.        Recommendations for follow up therapy are one component of a multi-disciplinary discharge planning process, led by the attending physician.  Recommendations may be updated based on patient status, additional functional criteria and insurance authorization.  Follow Up Recommendations Home health PT      Assistance Recommended at Discharge Intermittent Supervision/Assistance  Patient can return home with the following  A little help with walking and/or transfers;A little help with bathing/dressing/bathroom;Assistance with cooking/housework;Assist for transportation;Help with stairs or ramp for entrance    Equipment Recommendations None recommended by PT (patient has walker and BSC)  Recommendations for Other Services       Functional Status Assessment Patient has had a recent decline in their functional status and demonstrates the ability to make significant improvements in function in a reasonable and predictable amount of time.     Precautions / Restrictions Precautions Precautions: Fall;Posterior Hip Precaution Booklet Issued: No Restrictions Weight Bearing Restrictions: Yes LLE Weight Bearing: Weight bearing as tolerated      Mobility  Bed Mobility Overal bed mobility: Needs Assistance Bed Mobility: Supine to Sit     Supine to sit: Mod assist     General bed  mobility comments: mod A to raise trunk to seated position    Transfers Overall transfer level: Needs assistance Equipment used: Rolling walker (2 wheels) Transfers: Sit to/from Stand Sit to Stand: From elevated surface, Min assist           General transfer comment: Increased effort to power up to stand.    Ambulation/Gait Ambulation/Gait assistance: Min guard Gait Distance (Feet): 120 Feet Assistive device: Rolling walker (2 wheels) Gait Pattern/deviations: Step-to pattern, Decreased step length - right, Decreased step length - left, Decreased stride length, Decreased weight shift to left Gait velocity: decr     General Gait Details: Patient pain limited on L LE, but able to walk with RW and min guard.  Stairs            Wheelchair Mobility    Modified Rankin (Stroke Patients Only)       Balance Overall balance assessment: Needs assistance Sitting-balance support: Feet supported Sitting balance-Leahy Scale: Fair     Standing balance support: Bilateral upper extremity supported, During functional activity, Reliant on assistive device for balance Standing balance-Leahy Scale: Fair                               Pertinent Vitals/Pain Pain Assessment Pain Assessment: 0-10 Pain Score: 7  Pain Location: left hip Pain Descriptors / Indicators: Discomfort, Sore Pain Intervention(s): Monitored during session, Repositioned, Premedicated before session    [place] expects to be discharged to:: Private residence Living Arrangements: Alone Available Help at Discharge: Family;Available 24 hours/day Type of Home: House Home Access: Stairs to enter   [employer] of  Steps: 3   Home Layout: One level Home Equipment: Conservation officer, nature (2 wheels);BSC/3in1      Prior Function Prior Level of Function : Working/employed;Independent/Modified Independent             Mobility Comments: independent without AE ADLs Comments:  independent, employed as a Administrator   Dominant Hand: Right    Extremity/Trunk Assessment   Upper Extremity Assessment Upper Extremity Assessment: Defer to OT evaluation    Lower Extremity Assessment Lower Extremity Assessment: LLE deficits/detail LLE: Unable to fully assess due to pain LLE Coordination: decreased gross motor    Cervical / Trunk Assessment Cervical / Trunk Assessment: Normal  Communication   Communication: No difficulties  Cognition Arousal/Alertness: Awake/alert Behavior During Therapy: WFL for tasks assessed/performed Overall Cognitive Status: Within Functional Limits for tasks assessed                                          General Comments      Exercises     Assessment/Plan    PT Assessment Patient needs continued PT services  PT Problem List Decreased strength;Decreased mobility;Decreased balance;Decreased activity tolerance;Decreased range of motion;Pain;Decreased knowledge of precautions;Decreased coordination       PT Treatment Interventions DME instruction;Therapeutic exercise;Gait training;Functional mobility training;Therapeutic activities;Patient/family education;Stair training;Balance training;Neuromuscular re-education    PT Goals (Current goals can be found in the Care Plan section)  Acute Rehab PT Goals Patient Stated Goal: to go home PT Goal Formulation: With patient Time For Goal Achievement: 09/08/22 Potential to Achieve Goals: Good    Frequency BID     Co-evaluation               AM-PAC PT "6 Clicks" Mobility  Outcome Measure Help needed turning from your back to your side while in a flat bed without using bedrails?: A Little Help needed moving from lying on your back to sitting on the side of a flat bed without using bedrails?: A Lot Help needed moving to and from a bed to a chair (including a wheelchair)?: A Little Help needed standing up from a chair using your arms (e.g.,  wheelchair or bedside chair)?: A Little Help needed to walk in hospital room?: A Little Help needed climbing 3-5 steps with a railing? : A Little 6 Click Score: 17    End of Session Equipment Utilized During Treatment: Gait belt Activity Tolerance: Patient limited by pain Patient left: in chair Nurse Communication: Mobility status;Other (comment) (patient requesting private room) PT Visit Diagnosis: Unsteadiness on feet (R26.81);Other abnormalities of gait and mobility (R26.89);Muscle weakness (generalized) (M62.81);Difficulty in walking, not elsewhere classified (R26.2)    Time: 1355-1416 PT Time Calculation (min) (ACUTE ONLY): 21 min   Charges:   PT Evaluation $PT Eval Moderate Complexity: 1 Mod PT Treatments $Gait Training: 8-22 mins        Linus Weckerly, PT, GCS 09/01/22,2:53 PM

## 2022-09-01 NOTE — Op Note (Signed)
09/01/2022  10:11 AM  Patient:   Tommy Avila  Pre-Op Diagnosis:   Degenerative joint disease, left hip.  Post-Op Diagnosis:   Same.  Procedure:   Left total hip arthroplasty.  Surgeon:   Pascal Lux, MD  Assistant:   Cameron Proud, PA-C; Jerrilyn Cairo, PA-S  Anesthesia:   GET  Findings:   As above.  Complications:   None  EBL:   150 cc  Fluids:   1000 cc crystalloid  UOP:   None  TT:   None  Drains:   None  Closure:   Staples  Implants:   Biomet press-fit system with a #13 laterally offset Echo femoral stem, a 54 mm acetabular shell with an E-poly hi-wall liner, and a 36 mm ceramic head with a -3 mm neck.  Brief Clinical Note:   The patient is a 72 year old male with a history of progressively worsening left hip/groin pain. His symptoms have progressed despite medications, activity modification, etc. His history and examination are consistent with degenerative joint disease of the left hip, confirmed by plain radiographs. The patient is now 10 months status post a right total hip replacement from which she has done quite well. He presents today for a left total hip arthroplasty.   Procedure:   The patient was brought into the operating room and laid in the supine position.  After adequate general endotracheal intubation and anesthesia were obtained, the the patient was repositioned in the right lateral decubitus position and secured using a lateral hip positioner. The left hip and lower extremity were prepped with ChloroPrep solution before being draped sterilely. Preoperative antibiotics were administered. A timeout was performed to verify the appropriate surgical site.    A standard posterior approach to the hip was made through an approximately 4-5 inch incision. The incision was carried down through the subcutaneous tissues to expose the gluteal fascia and proximal end of the iliotibial band. These structures were split the length of the incision and the Charnley  self-retaining hip retractor placed. The bursal tissues were swept posteriorly to expose the short external rotators. The anterior border of the piriformis tendon was identified and this plane developed down through the capsule to enter the joint. A flap of tissue was elevated off the posterior aspect of the femoral neck and greater trochanter and retracted posteriorly. This flap included the piriformis tendon, the short external rotators, and the posterior capsule.   The hip was carefully dislocated. The piriformis fossa was debrided of soft tissues before the intramedullary canal was accessed through this point using a triple step reamer. The canal was reamed sequentially beginning with a #7 tapered reamer and progressing to a #13 tapered reamer. This provided excellent circumferential chatter. Using the appropriate guide, a femoral neck cut was made 10-12 mm above the lesser trochanter. The femoral head was removed.  Attention was directed to the acetabular side. The labrum was debrided circumferentially before the ligamentum teres was removed using a large curette. A line was drawn on the drapes corresponding to the native version of the acetabulum. This line was used as a guide while the acetabulum was reamed sequentially beginning with a 45 mm reamer and progressing to a 53 mm reamer. This provided excellent circumferential chatter. The 53 mm trial acetabulum was positioned and found to fit quite well. Therefore, the 54 mm acetabular shell was selected and impacted into place with care taken to maintain the appropriate version. The trial high wall liner was inserted.  Attention was redirected  to the femoral side. A box osteotome was used to establish version before the canal was broached sequentially beginning with a #7 broach and progressing to a #13 broach. This was left in place and several trial reductions performed using both the standard and laterally offset neck options, as well as the -6 and -3  mm neck lengths. After removing the trial components, the "manhole cover" was placed into the apex of the acetabular shell and tightened securely. The permanent E-polyethylene hi-wall liner was impacted into the acetabular shell and its locking mechanism verified using a quarter-inch osteotome. Next, the #13 laterally offset femoral stem was impacted into place with care taken to maintain the appropriate version. A repeat trial reduction was performed using the -3 mm neck length. The -3 mm neck length demonstrated excellent stability both in extension and external rotation as well as with flexion to 90 and internal rotation beyond 70. It also was stable in the position of sleep. In addition, leg lengths appeared to be restored appropriately by reassessing the position of the right leg over the left. The 36 mm ceramic head with the -3 mm neck was impacted onto the stem of the femoral component. The Morse taper locking mechanism was verified using manual distraction before the head was relocated and placed through a range of motion with the findings as described above.  The wound was copiously irrigated with sterile saline solution via the jet lavage system before the peri-incisional and pericapsular tissues were injected with a solution comprised of 30 cc of 0.5% Sensorcaine with epinephrine, 20 cc of Exparel, 2 cc of Kenalog 40 (80 mg), and 15 mg of Toradol diluted out to 90 cc with normal saline to help with postoperative analgesia. The posterior flap was reapproximated to the posterior aspect of the greater trochanter using #2 Tycron interrupted sutures placed through drill holes. Several additional #2 Tycron interrupted sutures were used to reinforce this layer of closure. The iliotibial band was reapproximated using #1 Vicryl interrupted sutures before the gluteal fascia was closed using a running #1 Vicryl suture. At this point, 1 g of transexemic acid in 10 cc of normal saline was injected into the joint  to help reduce postoperative bleeding. The subcutaneous tissues were closed in several layers using 2-0 Vicryl interrupted sutures before the skin was closed using staples. A sterile occlusive dressing was applied to the wound. The patient was then rolled back into the supine position on his/her hospital bed before being awakened, extubated, and returned to the recovery room in satisfactory condition after tolerating the procedure well.

## 2022-09-01 NOTE — Anesthesia Postprocedure Evaluation (Signed)
Anesthesia Post Note  Patient: Tommy Avila  Procedure(s) Performed: TOTAL HIP ARTHROPLASTY (Left: Hip)  Patient location during evaluation: PACU Anesthesia Type: General Level of consciousness: awake and alert Pain management: pain level controlled Vital Signs Assessment: post-procedure vital signs reviewed and stable Respiratory status: spontaneous breathing, nonlabored ventilation, respiratory function stable and patient connected to nasal cannula oxygen Cardiovascular status: blood pressure returned to baseline and stable Postop Assessment: no apparent nausea or vomiting Anesthetic complications: no   No notable events documented.   Last Vitals:  Vitals:   09/01/22 1100 09/01/22 1121  BP: (!) 101/53 103/87  Pulse: 66 67  Resp: 15 18  Temp: (!) 36.1 C   SpO2: 97% 99%    Last Pain:  Vitals:   09/01/22 1045  TempSrc:   PainSc: [place]

## 2022-09-01 NOTE — TOC Benefit Eligibility Note (Signed)
Patient Teacher, English as a foreign language completed.    The patient is currently admitted and upon discharge could be taking Eliquis 2.5 mg.  The current 30 day co-pay is $0.00.   The patient is insured through [place], [place] Patient Advocate Specialist [place] Patient Advocate Team Direct Number: [phone]  Fax: [phone]

## 2022-09-01 NOTE — H&P (Signed)
History of Present Illness: Tommy Avila is a 72 y.o. male who presents today for his surgical history and physical for his scheduled upcoming left total hip arthroplasty with Dr. Roland Rack on 09/01/2022. The patient denies any changes in his medical history since he was last evaluated. He denies any falls or trauma affecting the left hip since his last evaluation. His pain score today is a 7 out of 10 to the left hip. He denies any personal history of heart attack or stroke. No history of DVT. The patient does have a history of COPD, he does use inhalers routinely at this time. The patient denies any history of diabetes. He continues report pain along the left groin which is worse with prolonged periods of walking and standing.  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 Roland Rack)  back surgery  EXCISION GANGLION CYST WRIST PRIMARY   Past Family History: Diabetes Mother  Emphysema Father   Medications: acetaminophen (TYLENOL) 500 MG tablet Take by mouth 1,000 mg by mouth every 6 (six) hours as needed for mild pain, fever or headache.  acetaminophen (TYLENOL) 500 MG tablet Take by mouth  albuterol 90 mcg/actuation inhaler Inhale 2 inhalations into the lungs every 4 (four) hours as needed.  aspirin 81 MG EC tablet Take 81 mg by mouth once daily.  beclomethasone (QVAR) 80 mcg/actuation inhaler Inhale into the lungs 2 (two) times daily.  tiotropium (SPIRIVA WITH HANDIHALER) 18 mcg inhalation capsule Inhale 1 puff as directed once a day by mouth using inhaler   Allergies: Oxycodone (Hallucinations)   Review of Systems:  A comprehensive 14 point ROS was performed, reviewed by me today, and the pertinent orthopaedic  findings are documented in the HPI.  Physical Exam: BP 108/64  Ht 185.4 cm ('6\' 1"'$ )  Wt 65.3 kg (144 lb)  BMI 19.00 kg/m  General/Constitutional: The patient appears to be well-nourished, well-developed, and in no acute distress. Neuro/Psych: Normal mood and affect, oriented to person, place and time. Eyes: Non-icteric. Pupils are equal, round, and reactive to light, and exhibit synchronous movement. ENT: Unremarkable. Lymphatic: No palpable adenopathy. Respiratory: Lungs clear to auscultation, Normal chest excursion, No wheezes, and Non-labored breathing Cardiovascular: Regular rate and rhythm. No murmurs. and No edema, swelling or tenderness, except as noted in detailed exam. Integumentary: No impressive skin lesions present, except as noted in detailed exam. Musculoskeletal: Unremarkable, except as noted in detailed exam.  General: Well developed, well nourished 72 y.o. male in no apparent distress. Normal affect. Normal communication. Patient answers questions appropriately. The patient has a normal gait. There is no antalgic component. There is no hip lurch.   Lumbar Spine: The patient is able to flex close to 90 degrees in the lumbar spine, limited extension. He does have intact left and right bend with rotation at today's visit. Denies any significant increase in his left leg pain with range of motion lumbar spine. He does have some discomfort palpation directly over the vertebral bodies of the lumbar spine at today's visit.  Left Lower Extremities: Examination of the left lower extremities reveals no bony abnormality, no edema, and no ecchymosis. The patient is able to be passively flex close to 100 degrees in the left hip, he does have moderate pain with passive left  hip flexion. The patient is able to extend close to 5 degrees with moderate discomfort as well. Positive logroll maneuver to the left hip. With the left hip flexed 90 degrees he does have increased pain with both  internal and external rotation, he does have approximately 45 degrees of external rotation, 35 degrees of internal rotation with moderate discomfort. The patient does have a positive Corky Sox and FADIR test involving the left hip. The patient is non tender along the greater trochanter region. The patient has a negative [name]' test bilaterally. There is normal skin warmth. There is normal capillary refill bilaterally.   Neurologic: The patient has a negative straight leg raise. The patient has normal muscle strength testing for the quadriceps, calves, ankle dorsiflexion, ankle plantarflexion, and extensor hallicus longus. The patient has sensation that is intact to light touch. The deep tendon reflexes are normal at the patella and achilles. No clonus is noted.   Imaging: AP and lateral views of the left hip were obtained today in the office and reviewed by me. These x-rays demonstrate moderate osteoarthritic changes involving left hip with decreased joint space along the inferior aspect of the left hip, the patient does have moderate osteophyte formation off of the inferior aspect of the acetabulum and inferior aspect of the femoral head. The superior joint space appears to be relatively well-maintained however there is moderate subchondral sclerosis with osteophyte formation off of the superior aspect of the acetabulum as well. Cystic changes is identified involving the femoral neck. No evidence of avascular necrosis. No acute fractures identified. No evidence of dislocation.  Impression: 1. Primary osteoarthritis of left hip.  Plan:  1. Treatment options were discussed today with the patient. 2. The patient is scheduled for a left total hip arthroplasty with Dr. Roland Rack on 09/01/2022. 3. The patient was instructed on the risk and benefits of surgery and wishes to proceed at this time. 4. This document will serve as a surgical history and physical for the patient. 6. The patient will follow-up per  standard postop protocol. They can call the clinic they have any questions, new symptoms develop or symptoms worsen.  The procedure was discussed with the patient, as were the potential risks (including bleeding, infection, nerve and/or blood vessel injury, persistent or recurrent pain, failure of the hardware, hip dislocation, leg length inequality, heterotopic ossification, stiffness, need for further surgery, blood clots, strokes, heart attacks and/or arhythmias, pneumonia, etc.) and benefits. The patient states his understanding and wishes to proceed.    H&P reviewed and patient re-examined. No changes.

## 2022-09-01 NOTE — Transfer of Care (Signed)
Immediate Anesthesia Transfer of Care Note  Patient: Tommy Avila  Procedure(s) Performed: TOTAL HIP ARTHROPLASTY (Left: Hip)  Patient Location: PACU  Anesthesia Type:General  Level of Consciousness: awake  Airway & Oxygen Therapy: Patient Spontanous Breathing and Patient connected to face mask  Post-op Assessment: Report given to RN, Post -op Vital signs reviewed and stable, and Patient moving all extremities X 4  Post vital signs: Reviewed  Last Vitals:  Vitals Value Taken Time  BP 117/79   Temp 36.5 C 09/01/22 1007  Pulse 71   Resp 17   SpO2 98     Last Pain:  Vitals:   09/01/22 0613  TempSrc: Temporal  PainSc: 7          Complications: No notable events documented.

## 2022-09-02 DIAGNOSIS — M1612 Unilateral primary osteoarthritis, left hip: Secondary | ICD-10-CM | POA: Diagnosis not present

## 2022-09-02 LAB — CBC
HCT: 34.1 % — ABNORMAL LOW (ref 39.0–52.0)
Hemoglobin: 11.5 g/dL — ABNORMAL LOW (ref 13.0–17.0)
MCH: 31.7 pg (ref 26.0–34.0)
MCHC: 33.7 g/dL (ref 30.0–36.0)
MCV: 93.9 fL (ref 80.0–100.0)
Platelets: 229 10*3/uL (ref 150–400)
RBC: 3.63 MIL/uL — ABNORMAL LOW (ref 4.22–5.81)
RDW: 13.2 % (ref 11.5–15.5)
WBC: 16.5 10*3/uL — ABNORMAL HIGH (ref 4.0–10.5)
nRBC: 0 % (ref 0.0–0.2)

## 2022-09-02 LAB — BASIC METABOLIC PANEL
Anion gap: 5 (ref 5–15)
BUN: 15 mg/dL (ref 8–23)
CO2: 22 mmol/L (ref 22–32)
Calcium: 8.8 mg/dL — ABNORMAL LOW (ref 8.9–10.3)
Chloride: 109 mmol/L (ref 98–111)
Creatinine, Ser: 0.9 mg/dL (ref 0.61–1.24)
GFR, Estimated: >60 mL/min (ref >60)
Glucose, Bld: 142 mg/dL — ABNORMAL HIGH (ref 70–99)
Potassium: 4 mmol/L (ref 3.5–5.1)
Sodium: 136 mmol/L (ref 135–145)

## 2022-09-02 LAB — SURGICAL PATHOLOGY

## 2022-09-02 MED ORDER — ACETAMINOPHEN 500 MG PO TABS: 1000.0000 mg | ORAL_TABLET | Freq: Four times a day (QID) | ORAL | 0 refills | Status: DC

## 2022-09-02 MED ORDER — ONDANSETRON HCL 4 MG PO TABS: 4.0000 mg | ORAL_TABLET | Freq: Four times a day (QID) | ORAL | 0 refills | Status: DC | PRN

## 2022-09-02 MED ORDER — APIXABAN 2.5 MG PO TABS: 2.5000 mg | ORAL_TABLET | Freq: Two times a day (BID) | ORAL | 0 refills | Status: DC

## 2022-09-02 MED ORDER — TRAMADOL HCL 50 MG PO TABS: 50.0000 mg | ORAL_TABLET | Freq: Four times a day (QID) | ORAL | 0 refills | Status: DC | PRN

## 2022-09-02 NOTE — Discharge Summary (Signed)
Physician Discharge Summary  Patient ID: Tommy Avila MRN: [id number] DOB/AGE: May 14, 1951 72 y.o.  Admit date: 09/01/2022 Discharge date: 09/02/2022  Admission Diagnoses:  Status post total hip replacement, left [Z96.642] Primary osteoarthritis of the left hip.  Discharge Diagnoses: Patient Active Problem List   Diagnosis Date Noted   Status post total hip replacement, left 09/01/2022   Status post total hip replacement, right 11/02/2021   Colon cancer screening    Polyp of ascending colon    S/P inguinal hernia repair 06/11/2019   Non-recurrent unilateral inguinal hernia without obstruction or gangrene     Past Medical History:  Diagnosis Date   Arthritis    COPD (chronic obstructive pulmonary disease) (Tommy Avila)    Dyspnea    History of 2019 novel coronavirus disease (COVID-19) 10/13/2021   a.) tested (+) via home test.   Inguinal hernia    left   Psoriasis    Sensory ataxia    Vertebral artery stenosis, left      Transfusion: None.   Consultants (if any):   Discharged Condition: Improved  Hospital Course: Tommy Avila is an 71 y.o. male who was admitted 09/01/2022 with a diagnosis of primary osteoarthritis of the left hip and went to the operating room on 09/01/2022 and underwent the above named procedures.    Surgeries: Procedure(s): TOTAL HIP ARTHROPLASTY on 09/01/2022 Patient tolerated the surgery well. Taken to PACU where she was stabilized and then transferred to the orthopedic floor.  Started on Eliquis 2.'5mg'$  every 12 hrs. Heels elevated on bed with rolled towels. No evidence of DVT. Negative [place]. Physical therapy started on day #1 for gait training and transfer. OT started day #1 for ADL and assisted devices.  Patient's IV was removed on POD1.  Implants: Biomet press-fit system with a #13 laterally offset Echo femoral stem, a 54 mm acetabular shell with an E-poly hi-wall liner, and a 36 mm ceramic head with a -3 mm neck.   He was given perioperative  antibiotics:  Anti-infectives (From admission, onward)    Start     Dose/Rate Route Frequency Ordered Stop   09/01/22 1400  ceFAZolin (ANCEF) IVPB 2g/100 mL premix        2 g 200 mL/hr over 30 Minutes Intravenous Every 6 hours 09/01/22 1115 09/02/22 0154   09/01/22 0629  ceFAZolin (ANCEF) 2-4 GM/100ML-% IVPB       Note to Pharmacy: Tommy Avila J: cabinet override      09/01/22 0629 09/01/22 0821   09/01/22 0600  ceFAZolin (ANCEF) IVPB 2g/100 mL premix        2 g 200 mL/hr over 30 Minutes Intravenous On call to O.R. 08/31/22 2139 09/01/22 0753     .  He was given sequential compression devices, early ambulation, and Eliquis for DVT prophylaxis.  He benefited maximally from the hospital stay and there were no complications.    Recent vital signs:  Vitals:   09/01/22 1519 09/02/22 0032  BP: (!) 102/57 104/71  Pulse: 63 68  Resp: 18 17  Temp: (!) 97.5 F (36.4 C) 97.9 F (36.6 C)  SpO2: 96% 95%    Recent laboratory studies:  Lab Results  Component Value Date   HGB 11.5 (L) 09/02/2022   HGB 14.6 09/01/2022   HGB 14.7 08/22/2022   Lab Results  Component Value Date   WBC 16.5 (H) 09/02/2022   PLT 229 09/02/2022   No results found for: "INR" Lab Results  Component Value Date   NA 136 09/02/2022  K 4.0 09/02/2022   CL 109 09/02/2022   CO2 22 09/02/2022   BUN 15 09/02/2022   CREATININE 0.90 09/02/2022   GLUCOSE 142 (H) 09/02/2022    Discharge Medications:   Allergies as of 09/02/2022       Reactions   Oxycodone Other (See Comments)   Hallucinations        Medication List     STOP taking these medications    aspirin EC 81 MG tablet       TAKE these medications    acetaminophen 500 MG tablet Commonly known as: TYLENOL Take 2 tablets (1,000 mg total) by mouth every 6 (six) hours.   albuterol 108 (90 Base) MCG/ACT inhaler Commonly known as: VENTOLIN HFA Inhale 1-2 puffs into the lungs every 6 (six) hours as needed for wheezing or shortness of  breath.   apixaban 2.5 MG Tabs tablet Commonly known as: ELIQUIS Take 1 tablet (2.5 mg total) by mouth 2 (two) times daily.   calcium carbonate 1250 (500 Ca) MG tablet Commonly known as: OS-CAL - dosed in mg of elemental calcium Take 1 tablet by mouth at bedtime.   Flovent HFA 110 MCG/ACT inhaler Generic drug: fluticasone Inhale 1 puff into the lungs daily as needed (shortness).   lidocaine 2 % solution Commonly known as: XYLOCAINE Use as directed 15 mLs in the mouth or throat every 6 (six) hours as needed for mouth pain.   montelukast 10 MG tablet Commonly known as: SINGULAIR Take 10 mg by mouth at bedtime.   ondansetron 4 MG tablet Commonly known as: ZOFRAN Take 1 tablet (4 mg total) by mouth every 6 (six) hours as needed for nausea.   potassium chloride SA 20 MEQ tablet Commonly known as: KLOR-CON M Take 2 tablets (40 mEq) today, then 1 tablet (20 mEq) daily until surgery. Be sure to take dose on day of surgery. Follow up with PCP for repeat labs.   Spiriva HandiHaler 18 MCG inhalation capsule Generic drug: tiotropium Place 18 mcg into inhaler and inhale daily.   traMADol 50 MG tablet Commonly known as: Ultram Take 1-2 tablets (50-100 mg total) by mouth every 6 (six) hours as needed.               Durable Medical Equipment  (From admission, onward)           Start     Ordered   09/01/22 1116  DME Bedside commode  Once       Question:  Patient needs a bedside commode to treat with the following condition  Answer:  Status post total hip replacement, left   09/01/22 1115   09/01/22 1116  DME 3 n 1  Once        09/01/22 1115   09/01/22 1116  DME Walker rolling  Once       Question Answer Comment  Walker: With 5 Inch Wheels   Patient needs a walker to treat with the following condition Status post total hip replacement, left      09/01/22 1115           Diagnostic Studies: DG HIP UNILAT W OR W/O PELVIS 2-3 VIEWS LEFT  Result Date:  09/01/2022 CLINICAL DATA:  Status post total hip replacement, left EXAM: DG HIP (WITH OR WITHOUT PELVIS) 2-3V LEFT COMPARISON:  Radiograph 11/02/2021 FINDINGS: Postsurgical changes of left hip arthroplasty. Normal alignment thought evidence of fracture. Expected soft tissue changes. Unchanged right hip arthroplasty. IMPRESSION: Postsurgical changes of left hip arthroplasty. No  evidence of immediate hardware complication. Electronically Signed   By: Maurine Simmering M.D.   On: 09/01/2022 11:17    Disposition: Plan for discharge home this afternoon pending progress with PT.   Follow-up Information     Lattie Corns, PA-C Follow up in 14 day(s).   Specialty: Physician Assistant Why: Electa Sniff information: [place] [phone]                Signed: Judson Roch PA-C 09/02/2022, 7:30 AM

## 2022-09-02 NOTE — Progress Notes (Signed)
Physical Therapy Treatment Patient Details Name: Tommy Avila MRN: [id number] DOB: Dec 25, 1950 Today's Date: 09/02/2022   History of Present Illness Patient is a 72 year old male s/p L THA. PMH includes R THA in march and COPD, shoulder surgery, back surgery, smoker.    PT Comments    Patient received in bed, he wants to go home. Assisted patient with donning pants over L LE. Otherwise he is mod I with getting dressed. Good balance in standing when pulling on pants. Patient ambulated >200 feet with rw and min guard. Also ambulated up/down 4 steps with single rail and supervision. Patient mobilizing well. Reviewed hip precautions and handout given. Patient will continue to benefit from skilled PT to improve safety and independence.     Recommendations for follow up therapy are one component of a multi-disciplinary discharge planning process, led by the attending physician.  Recommendations may be updated based on patient status, additional functional criteria and insurance authorization.  Follow Up Recommendations  Home health PT     Assistance Recommended at Discharge Intermittent Supervision/Assistance  Patient can return home with the following A little help with walking and/or transfers;A little help with bathing/dressing/bathroom;Assistance with cooking/housework;Assist for transportation;Help with stairs or ramp for entrance   Equipment Recommendations  None recommended by PT    Recommendations for Other Services       Precautions / Restrictions Precautions Precautions: Posterior Hip;Fall Precaution Booklet Issued: Yes (comment) Restrictions Weight Bearing Restrictions: Yes LLE Weight Bearing: Weight bearing as tolerated     Mobility  Bed Mobility Overal bed mobility: Modified Independent Bed Mobility: Supine to Sit     Supine to sit: Modified independent (Device/Increase time)          Transfers Overall transfer level: Modified independent Equipment used:  Rolling walker (2 wheels) Transfers: Sit to/from Stand Sit to Stand: Modified independent (Device/Increase time)                Ambulation/Gait Ambulation/Gait assistance: Supervision, Min guard Gait Distance (Feet): 200 Feet Assistive device: Rolling walker (2 wheels) Gait Pattern/deviations: Step-through pattern Gait velocity: WNL     General Gait Details: improved mobility this date. Ambulating with reciprocal gait pattern. NO lob   Stairs Stairs: Yes Stairs assistance: Supervision Stair Management: One rail Left, Step to pattern, Sideways Number of Stairs: 4 General stair comments: generally safe with supervision on steps   Wheelchair Mobility    Modified Rankin (Stroke Patients Only)       Balance Overall balance assessment: Modified Independent Sitting-balance support: Feet supported Sitting balance-Leahy Scale: Normal     Standing balance support: Bilateral upper extremity supported, During functional activity, Reliant on assistive device for balance Standing balance-Leahy Scale: Good Standing balance comment: no lob                            Cognition Arousal/Alertness: Awake/alert Behavior During Therapy: WFL for tasks assessed/performed Overall Cognitive Status: Within Functional Limits for tasks assessed                                          Exercises      General Comments        Pertinent Vitals/Pain Pain Assessment Pain Assessment: Faces Faces Pain Scale: Hurts a little bit Pain Location: left hip Pain Descriptors / Indicators: Discomfort Pain Intervention(s): Monitored during session, Repositioned  Home Living                          Prior Function            PT Goals (current goals can now be found in the care plan section) Acute Rehab PT Goals Patient Stated Goal: to go home PT Goal Formulation: With patient Time For Goal Achievement: 09/08/22 Potential to Achieve Goals:  Good Progress towards PT goals: Progressing toward goals    Frequency    BID      PT Plan      Co-evaluation              AM-PAC PT "6 Clicks" Mobility   Outcome Measure  Help needed turning from your back to your side while in a flat bed without using bedrails?: None Help needed moving from lying on your back to sitting on the side of a flat bed without using bedrails?: None Help needed moving to and from a bed to a chair (including a wheelchair)?: A Little Help needed standing up from a chair using your arms (e.g., wheelchair or bedside chair)?: A Little Help needed to walk in hospital room?: A Little Help needed climbing 3-5 steps with a railing? : A Little 6 Click Score: 20    End of Session   Activity Tolerance: Patient tolerated treatment well Patient left: in chair;with call bell/phone within reach Nurse Communication: Mobility status;Other (comment) (patient ready for discharge) PT Visit Diagnosis: Muscle weakness (generalized) (M62.81);Other abnormalities of gait and mobility (R26.89);Pain Pain - Right/Left: Left Pain - part of body: Hip     Time: 0943-1000 PT Time Calculation (min) (ACUTE ONLY): 17 min  Charges:  $Gait Training: 8-22 mins                     [employer], PT, GCS 09/02/22,10:02 AM

## 2022-09-02 NOTE — Progress Notes (Signed)
Alert and oriented x 4. Had a period of confusion and got up by himself to ambulate. He thought he was going to hospital to have surgery, was reminded he had surgery already. Bed alarm in use. Call bell within reach. Continue plan of care.

## 2022-09-02 NOTE — Progress Notes (Signed)
  Subjective: 1 Day Post-Op Procedure(s) (LRB): TOTAL HIP ARTHROPLASTY (Left) Patient reports pain as mild.   Patient is well, and has had no acute complaints or problems Plan is to go Home after hospital stay. Negative for chest pain and shortness of breath Fever: no Gastrointestinal:Negative for nausea and vomiting Reports he is passing gas this morning.  Objective: Vital signs in last 24 hours: Temp:  [97 F (36.1 C)-98 F (36.7 C)] 97.9 F (36.6 C) (01/19 0032) Pulse Rate:  [63-77] 68 (01/19 0032) Resp:  [14-18] 17 (01/19 0032) BP: (101-116)/(53-87) 104/71 (01/19 0032) SpO2:  [94 %-100 %] 95 % (01/19 0032)  Intake/Output from previous day:  Intake/Output Summary (Last 24 hours) at 09/02/2022 0725 Last data filed at 09/02/2022 0336 Gross per 24 hour  Intake 1627.27 ml  Output 1050 ml  Net 577.27 ml    Intake/Output this shift: No intake/output data recorded.  Labs: Recent Labs    09/01/22 0708 09/02/22 0528  HGB 14.6 11.5*   Recent Labs    09/01/22 0708 09/02/22 0528  WBC  --  16.5*  RBC  --  3.63*  HCT 43.0 34.1*  PLT  --  229   Recent Labs    09/01/22 0708 09/02/22 0528  NA 142 136  K 3.5 4.0  CL 106 109  CO2  --  22  BUN 8 15  CREATININE 0.80 0.90  GLUCOSE 73 142*  CALCIUM  --  8.8*   No results for input(s): "LABPT", "INR" in the last 72 hours.   EXAM General - Patient is Alert, Appropriate, and Oriented Extremity - ABD soft Neurovascular intact Intact pulses distally Dorsiflexion/Plantar flexion intact Incision: moderate drainage No cellulitis present Compartment soft Dressing/Incision - bloody drainage noted to the distal and proximal aspect of the honeycomb dressing Motor Function - intact, moving foot and toes well on exam.  Abdomen soft with intact bowels sounds this morning.  Past Medical History:  Diagnosis Date   Arthritis    COPD (chronic obstructive pulmonary disease) ([place])    Dyspnea    History of 2019 novel  coronavirus disease (COVID-19) 10/13/2021   a.) tested (+) via home test.   Inguinal hernia    left   Psoriasis    Sensory ataxia    Vertebral artery stenosis, left     Assessment/Plan: 1 Day Post-Op Procedure(s) (LRB): TOTAL HIP ARTHROPLASTY (Left) Principal Problem:   Status post total hip replacement, left  Estimated body mass index is 19.39 kg/m as calculated from the following:   Height as of this encounter: '6\' 1"'$  (1.854 m).   Weight as of this encounter: 66.7 kg. Advance diet Up with therapy D/C IV fluids when tolerating po intake.  Labs and vitals reviewed. WBC 16.5, denies any chest pain, SOB, urinary symptoms this morning. Reports pain is mild this AM, passing gas. Up with therapy today. Plan for discharge home this afternoon pending progress with PT.  DVT Prophylaxis - TED hose and Eliquis Weight-Bearing as tolerated to left leg  J. Cameron Proud, PA-C [facility] 09/02/2022, 7:25 AM

## 2022-09-02 NOTE — TOC Progression Note (Signed)
Transition of Care [facility]) - Progression Note    Patient Details  Name: Tommy Avila MRN: [id number] Date of Birth: 05-Feb-1951  Transition of Care [facility]) CM/SW [place], RN Phone Number: 09/02/2022, 10:09 AM  Clinical Narrative:    The patient is set up with [place] prior to surgery by Surgeons office, he has a 3 in 1 and rw at home   Expected Discharge Plan: [place] Barriers to Discharge: No Barriers Identified  Expected Discharge Plan and Services   Discharge Planning Services: CM Consult   Living arrangements for the past 2 months: Single Family Home Expected Discharge Date: 09/02/22               DME Arranged: N/A DME Agency: NA       HH Arranged: PT HH Agency: [place] Date [place] Agency Contacted: 09/02/22 Time [place]: 1007 Representative spoke with at [place]: [place]   Social Determinants of Health ([place]) Interventions SDOH Screenings   Food Insecurity: No Food Insecurity (09/01/2022)  Housing: Low Risk  (09/01/2022)  Transportation Needs: No Transportation Needs (09/01/2022)  Utilities: Not At Risk (09/01/2022)  Tobacco Use: High Risk (09/01/2022)    Readmission Risk Interventions     No data to display

## 2022-09-02 NOTE — Discharge Instructions (Signed)
Instructions after Total Hip Replacement     J. Jeffrey Poggi, M.D.  J. Lance Sami Froh, PA-C     Dept. of Orthopaedics & Sports Medicine  [facility]  [address]  , [place]  [address]  Phone: [phone]   Fax: [phone]    DIET: Drink plenty of non-alcoholic fluids. Resume your normal diet. Include foods high in fiber.  ACTIVITY:  You may use crutches or a walker with weight-bearing as tolerated, unless instructed otherwise. You may be weaned off of the walker or crutches by your Physical Therapist.  Do NOT reach below the level of your knees or cross your legs until allowed.    Continue doing gentle exercises. Exercising will reduce the pain and swelling, increase motion, and prevent muscle weakness.   Please continue to use the TED compression stockings for 6 weeks. You may remove the stockings at night, but should reapply them in the morning. Do not drive or operate any equipment until instructed.  WOUND CARE:  Continue to use ice packs periodically to reduce pain and swelling. Keep the incision clean and dry. You may bathe or shower after the staples are removed at the first office visit following surgery.  MEDICATIONS: You may resume your regular medications. Please take the pain medication as prescribed on the medication. Do not take pain medication on an empty stomach. You have been given a prescription for a blood thinner to prevent blood clots. Please take the medication as instructed. (NOTE: After completing a 2 week course of Eliquis, take one Enteric-coated aspirin once a day.) Pain medications and iron supplements can cause constipation. Use a stool softener (Senokot or Colace) on a daily basis and a laxative (dulcolax or miralax) as needed. Do not drive or drink alcoholic beverages when taking pain medications.  CALL THE OFFICE FOR: Temperature above 101 degrees Excessive bleeding or drainage on the dressing. Excessive swelling, coldness, or  paleness of the toes. Persistent nausea and vomiting.  FOLLOW-UP:  You should have an appointment to return to the office in 2 weeks after surgery. Arrangements have been made for continuation of Physical Therapy (either home therapy or outpatient therapy).  

## 2022-09-13 ENCOUNTER — Encounter: Payer: Self-pay | Admitting: Surgery

## 2023-06-29 ENCOUNTER — Encounter: Payer: Self-pay | Admitting: Internal Medicine

## 2023-07-11 IMAGING — DX DG HIP (WITH OR WITHOUT PELVIS) 2-3V*R*
2 series · 2 of 2 positions shown · non-contrast
Comparison: None.

CLINICAL DATA: Status post total hip replacement

EXAM:
DG HIP (WITH OR WITHOUT PELVIS) 2V RIGHT

[pelvis ap]
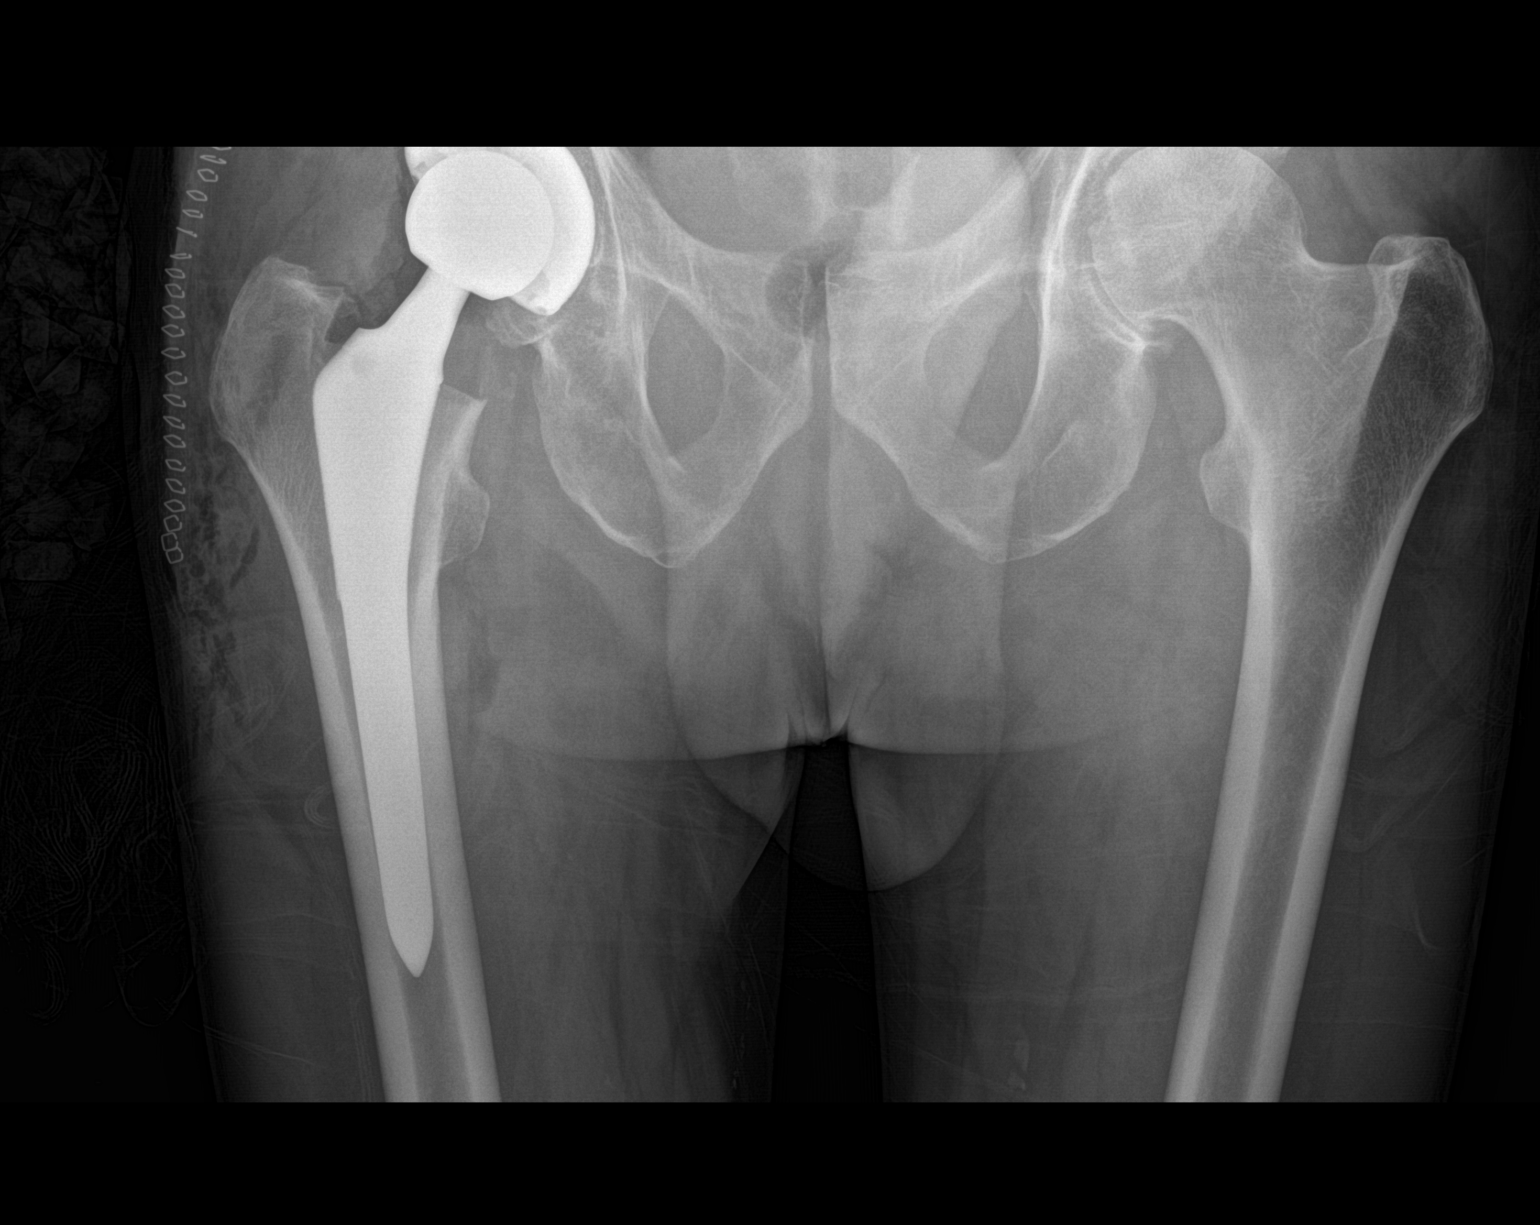

[hip lat]
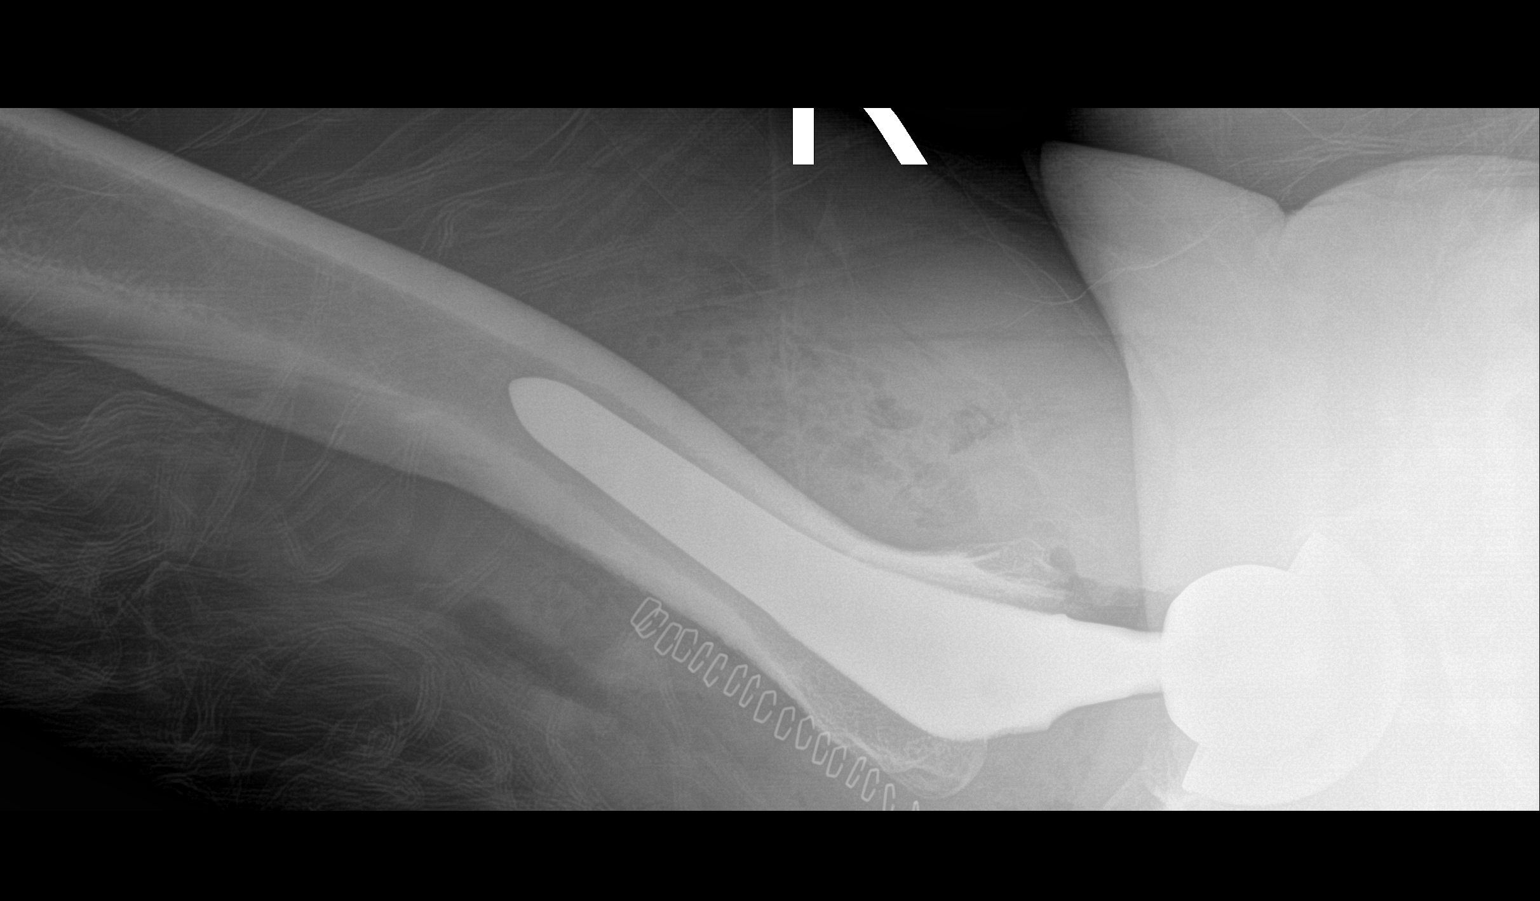

[2 of 2 positions shown; findings below may reference images not displayed]

FINDINGS: Interval postsurgical changes from right total hip arthroplasty.
Arthroplasty components appear in their expected alignment. No
periprosthetic fracture is identified. Expected postoperative
changes within the overlying soft tissues.
IMPRESSION: Expected postsurgical changes of right total hip replacement.

## 2023-12-19 ENCOUNTER — Other Ambulatory Visit: Payer: Self-pay | Admitting: Student

## 2023-12-19 DIAGNOSIS — Z96612 Presence of left artificial shoulder joint: Secondary | ICD-10-CM

## 2023-12-27 ENCOUNTER — Encounter: Admission: RE | Admit: 2023-12-27 | Source: Ambulatory Visit

## 2023-12-27 ENCOUNTER — Encounter: Attending: Student

## 2023-12-31 ENCOUNTER — Emergency Department

## 2023-12-31 ENCOUNTER — Emergency Department
Admission: EM | Admit: 2023-12-31 | Discharge: 2023-12-31 | Disposition: A | Discharge status: Home / Self Care | Attending: Emergency Medicine | Admitting: Emergency Medicine

## 2023-12-31 ENCOUNTER — Encounter: Payer: Self-pay | Admitting: Intensive Care

## 2023-12-31 ENCOUNTER — Other Ambulatory Visit: Payer: Self-pay

## 2023-12-31 DIAGNOSIS — R9389 Abnormal findings on diagnostic imaging of other specified body structures: Secondary | ICD-10-CM | POA: Insufficient documentation

## 2023-12-31 DIAGNOSIS — R079 Chest pain, unspecified: Secondary | ICD-10-CM | POA: Diagnosis present

## 2023-12-31 DIAGNOSIS — R42 Dizziness and giddiness: Secondary | ICD-10-CM | POA: Insufficient documentation

## 2023-12-31 DIAGNOSIS — J449 Chronic obstructive pulmonary disease, unspecified: Secondary | ICD-10-CM | POA: Insufficient documentation

## 2023-12-31 DIAGNOSIS — I639 Cerebral infarction, unspecified: Secondary | ICD-10-CM

## 2023-12-31 HISTORY — DX: Pure hypercholesterolemia, unspecified: E78.00

## 2023-12-31 HISTORY — DX: Cerebral infarction, unspecified: I63.9

## 2023-12-31 LAB — CBC
HCT: 41.4 % (ref 39.0–52.0)
Hemoglobin: 13.8 g/dL (ref 13.0–17.0)
MCH: 31.8 pg (ref 26.0–34.0)
MCHC: 33.3 g/dL (ref 30.0–36.0)
MCV: 95.4 fL (ref 80.0–100.0)
Platelets: 236 10*3/uL (ref 150–400)
RBC: 4.34 MIL/uL (ref 4.22–5.81)
RDW: 12.9 % (ref 11.5–15.5)
WBC: 6.6 10*3/uL (ref 4.0–10.5)
nRBC: 0 % (ref 0.0–0.2)

## 2023-12-31 LAB — BASIC METABOLIC PANEL WITH GFR
Anion gap: 7 (ref 5–15)
BUN: 11 mg/dL (ref 8–23)
CO2: 22 mmol/L (ref 22–32)
Calcium: 8.8 mg/dL — ABNORMAL LOW (ref 8.9–10.3)
Chloride: 110 mmol/L (ref 98–111)
Creatinine, Ser: 0.89 mg/dL (ref 0.61–1.24)
GFR, Estimated: >60 mL/min (ref >60)
Glucose, Bld: 112 mg/dL — ABNORMAL HIGH (ref 70–99)
Potassium: 3.7 mmol/L (ref 3.5–5.1)
Sodium: 139 mmol/L (ref 135–145)

## 2023-12-31 LAB — PROTIME-INR
INR: 1.1 (ref 0.8–1.2)
Prothrombin Time: 13.9 s (ref 11.4–15.2)

## 2023-12-31 LAB — TROPONIN I (HIGH SENSITIVITY)
Troponin I (High Sensitivity): <2 ng/L (ref <18)
Troponin I (High Sensitivity): 3 ng/L (ref <18)

## 2023-12-31 MED ORDER — IOHEXOL 350 MG/ML SOLN
75.0000 mL | Freq: Once | INTRAVENOUS | Status: AC | PRN
  Administered 2023-12-31: 75 mL via INTRAVENOUS

## 2023-12-31 NOTE — Discharge Instructions (Addendum)
 You were seen in the emergency department for chest discomfort and lightheadedness.  You had a troponin that was negative, do not believe you are having a heart attack today.  You need to follow-up closely with outpatient cardiology as you likely need further stress testing and workup for your heart.  You had an MRI done of your brain that showed a questionable new area that was an infarct versus artifact.  After discussion with neurology they were less concerned that this was consistent with your symptoms today.  Continue to take a baby aspirin since you have had a stroke in the past.  You need to follow-up closely with your primary care physician as you likely need an echocardiogram and further workup.  Return to the emergency department for any worsening symptoms.

## 2023-12-31 NOTE — ED Provider Notes (Addendum)
 [facility] Provider Note    Event Date/Time   First MD Initiated Contact with Patient 12/31/23 1305     (approximate)   History   Chest Pain   HPI  Tommy Avila is a 73 y.o. male past medical history significant for COPD, history of cerebral infarction presents the emergency department with chest pain and trouble with walking.  Patient states that he developed chest pain on Thursday or Friday and was evaluated at his primary care physician office.  States that he has been feeling lightheaded and feeling off balance.  States that he was told that if his symptoms return to return to the emergency department.  Denies any falls or trauma.  Denies any significant headache.  Denies change in vision.  Denies any slurring of speech.  Denies any extremity numbness or weakness.  Denies any alcohol use.     Physical Exam   Triage Vital Signs: ED Triage Vitals  Encounter Vitals Group     BP 12/31/23 1309 (!) 149/76     Systolic BP Percentile --      Diastolic BP Percentile --      Pulse Rate 12/31/23 1309 72     Resp 12/31/23 1309 15     Temp 12/31/23 1309 97.8 F (36.6 C)     Temp Source 12/31/23 1309 Oral     SpO2 12/31/23 1309 100 %     Weight 12/31/23 1310 145 lb (65.8 kg)     Height 12/31/23 1310 6\' 1"  (1.854 m)     Head Circumference --      Peak Flow --      Pain Score 12/31/23 1310 9     Pain Loc --      Pain Education --      Exclude from Growth Chart --     Most recent vital signs: Vitals:   12/31/23 1400 12/31/23 1530  BP: 118/72 134/82  Pulse: (!) 54 (!) 48  Resp: 20 17  Temp:    SpO2: 99% 100%    Physical Exam Constitutional:      Appearance: He is well-developed.  HENT:     Head: Atraumatic.  Eyes:     Conjunctiva/sclera: Conjunctivae normal.  Cardiovascular:     Rate and Rhythm: Regular rhythm.     Heart sounds: Normal heart sounds. No murmur heard. Pulmonary:     Effort: No respiratory distress.  Abdominal:      Palpations: Abdomen is soft.     Tenderness: There is no abdominal tenderness.  Musculoskeletal:     Cervical back: Normal range of motion.  Skin:    General: Skin is warm.  Neurological:     Mental Status: He is alert. Mental status is at baseline.     GCS: GCS eye subscore is 4. GCS verbal subscore is 5. GCS motor subscore is 6.     Cranial Nerves: Cranial nerves 2-12 are intact.     Sensory: Sensation is intact.     Motor: Motor function is intact.     Coordination: Coordination is intact.     Gait: Tandem walk abnormal.     Comments: Abnormal tandem gait however otherwise has a normal gait.     IMPRESSION / MDM / ASSESSMENT AND PLAN / ED COURSE  I reviewed the triage vital signs and the nursing notes.  Differential diagnosis including ACS, dysrhythmia, anemia, electrolyte abnormality, dehydration, orthostatic hypotension, dissection, vertebrobasilar insufficiency, posterior circulation CVA  EKG  I, Viviano Ground,  the attending physician, personally viewed and interpreted this ECG.   Rate: Normal  Rhythm: Normal sinus  Axis: Normal  Intervals: Normal  ST&T Change: None  No tachycardic or bradycardic dysrhythmias while on cardiac telemetry.  RADIOLOGY I independently reviewed imaging, my interpretation of imaging: CTA head and neck with no findings of intracranial hemorrhage  Read as no large vessel cutoff and no signs of dissection.  MRI with punctate area of questionable acute versus subacute infarct versus artifact.  Prior cerebellar infarct  LABS (all labs ordered are listed, but only abnormal results are displayed) Labs interpreted as -    Labs Reviewed  BASIC METABOLIC PANEL WITH GFR - Abnormal; Notable for the following components:      Result Value   Glucose, Bld 112 (*)    Calcium  8.8 (*)    All other components within normal limits  CBC  PROTIME-INR  TROPONIN I (HIGH SENSITIVITY)  TROPONIN I (HIGH SENSITIVITY)     MDM  Orthostatic blood  pressures are negative.  Lab work overall reassuring.  No significant electrolyte abnormality.  Patient able to ambulate in the emergency department.  Discussed with neurology Dr. Renaee Caro, who reviewed the patient's MRI as a second opinion of artifact versus acute stroke given that it did not quite fit with his symptoms.  He recommended that it was only seen on 1 image and more likely artifact however cannot stay 100% certain that it was not a stroke.  ABCD score was 1.  Patient is ambulating without any difficulties.  Shared decision making with the patient and offered admission, ultimately patient states that he will go home and follow-up as an outpatient with primary care.  Given information to follow-up as an outpatient with neurology.  Also given a referral for cardiology for further stress testing/cardiac workup and echocardiogram to complete CVA workup.  Given strict return precautions for any ongoing or worsening symptoms.  Discussed taking a daily aspirin.     PROCEDURES:  Critical Care performed: No  Procedures  Patient's presentation is most consistent with acute presentation with potential threat to life or bodily function.   MEDICATIONS ORDERED IN ED: Medications  iohexol (OMNIPAQUE) 350 MG/ML injection 75 mL (75 mLs Intravenous Contrast Given 12/31/23 1417)    FINAL CLINICAL IMPRESSION(S) / ED DIAGNOSES   Final diagnoses:  Chest pain, unspecified type  Lightheadedness  Abnormal MRI     Rx / DC Orders   ED Discharge Orders          Ordered    Ambulatory referral to Cardiology       Comments: If you have not heard from the Cardiology office within the next 72 hours please call [phone].   12/31/23 1617             Note:  This document was prepared using Dragon voice recognition software and may include unintentional dictation errors.   Viviano Ground, MD 12/31/23 Marne Sings    Viviano Ground, MD 12/31/23 1622

## 2023-12-31 NOTE — ED Triage Notes (Signed)
 Patient c/o central chest pressure that started Thursday. Seen at PCP Friday and told to come to ER if pain got worse  Daily smoker

## 2024-01-24 ENCOUNTER — Other Ambulatory Visit: Payer: Self-pay | Admitting: Physician Assistant

## 2024-01-24 DIAGNOSIS — Z8673 Personal history of transient ischemic attack (TIA), and cerebral infarction without residual deficits: Secondary | ICD-10-CM

## 2024-02-19 ENCOUNTER — Ambulatory Visit
Admission: RE | Admit: 2024-02-19 | Discharge: 2024-02-19 | Disposition: A | Discharge status: Home / Self Care | Source: Ambulatory Visit | Attending: Physician Assistant | Admitting: Physician Assistant

## 2024-02-19 DIAGNOSIS — Z8673 Personal history of transient ischemic attack (TIA), and cerebral infarction without residual deficits: Secondary | ICD-10-CM | POA: Insufficient documentation

## 2024-03-13 ENCOUNTER — Other Ambulatory Visit: Payer: Self-pay | Admitting: Student

## 2024-03-13 DIAGNOSIS — G8929 Other chronic pain: Secondary | ICD-10-CM

## 2024-03-13 DIAGNOSIS — M7581 Other shoulder lesions, right shoulder: Secondary | ICD-10-CM

## 2024-03-13 DIAGNOSIS — M7521 Bicipital tendinitis, right shoulder: Secondary | ICD-10-CM

## 2024-03-13 DIAGNOSIS — M25811 Other specified joint disorders, right shoulder: Secondary | ICD-10-CM

## 2024-03-14 ENCOUNTER — Ambulatory Visit
Admission: RE | Admit: 2024-03-14 | Discharge: 2024-03-14 | Disposition: A | Discharge status: Home / Self Care | Source: Ambulatory Visit | Attending: Student | Admitting: Student

## 2024-03-14 DIAGNOSIS — M7521 Bicipital tendinitis, right shoulder: Secondary | ICD-10-CM | POA: Diagnosis present

## 2024-03-14 DIAGNOSIS — M25511 Pain in right shoulder: Secondary | ICD-10-CM | POA: Diagnosis present

## 2024-03-14 DIAGNOSIS — M25811 Other specified joint disorders, right shoulder: Secondary | ICD-10-CM | POA: Insufficient documentation

## 2024-03-14 DIAGNOSIS — G8929 Other chronic pain: Secondary | ICD-10-CM | POA: Diagnosis present

## 2024-03-14 DIAGNOSIS — M7581 Other shoulder lesions, right shoulder: Secondary | ICD-10-CM | POA: Diagnosis present

## 2024-03-20 ENCOUNTER — Other Ambulatory Visit: Payer: Self-pay | Admitting: Surgery

## 2024-03-21 ENCOUNTER — Encounter

## 2024-03-21 ENCOUNTER — Encounter: Payer: Self-pay | Admitting: Surgery

## 2024-03-21 ENCOUNTER — Encounter
Admission: RE | Disposition: A | Discharge status: Home / Self Care | Payer: Self-pay | Source: Home / Self Care | Attending: Surgery

## 2024-03-21 ENCOUNTER — Other Ambulatory Visit: Payer: Self-pay

## 2024-03-21 ENCOUNTER — Ambulatory Visit
Admission: RE | Admit: 2024-03-21 | Discharge: 2024-03-21 | Disposition: A | Discharge status: Home / Self Care | Attending: Surgery | Admitting: Surgery

## 2024-03-21 DIAGNOSIS — M7521 Bicipital tendinitis, right shoulder: Secondary | ICD-10-CM | POA: Insufficient documentation

## 2024-03-21 DIAGNOSIS — K4090 Unilateral inguinal hernia, without obstruction or gangrene, not specified as recurrent: Secondary | ICD-10-CM

## 2024-03-21 DIAGNOSIS — Z8673 Personal history of transient ischemic attack (TIA), and cerebral infarction without residual deficits: Secondary | ICD-10-CM | POA: Insufficient documentation

## 2024-03-21 DIAGNOSIS — M7581 Other shoulder lesions, right shoulder: Secondary | ICD-10-CM | POA: Insufficient documentation

## 2024-03-21 DIAGNOSIS — Z539 Procedure and treatment not carried out, unspecified reason: Secondary | ICD-10-CM | POA: Diagnosis not present

## 2024-03-21 DIAGNOSIS — Z01818 Encounter for other preprocedural examination: Secondary | ICD-10-CM

## 2024-03-21 DIAGNOSIS — M25811 Other specified joint disorders, right shoulder: Secondary | ICD-10-CM | POA: Insufficient documentation

## 2024-03-21 SURGERY — ARTHROSCOPY, SHOULDER WITH DEBRIDEMENT
Anesthesia: Choice | Site: Shoulder | Laterality: Right

## 2024-03-21 MED ORDER — LACTATED RINGERS IV SOLN: INTRAVENOUS | Status: DC

## 2024-03-21 MED ORDER — PROPOFOL 10 MG/ML IV BOLUS
INTRAVENOUS | Status: AC
  Filled 2024-03-21: qty 40

## 2024-03-21 MED ORDER — ROCURONIUM BROMIDE 10 MG/ML (PF) SYRINGE
PREFILLED_SYRINGE | INTRAVENOUS | Status: AC
  Filled 2024-03-21: qty 10

## 2024-03-21 MED ORDER — CEFAZOLIN SODIUM-DEXTROSE 2-4 GM/100ML-% IV SOLN
INTRAVENOUS | Status: AC
  Filled 2024-03-21: qty 100

## 2024-03-21 MED ORDER — FENTANYL CITRATE (PF) 100 MCG/2ML IJ SOLN
INTRAMUSCULAR | Status: AC
  Filled 2024-03-21: qty 2

## 2024-03-21 MED ORDER — CHLORHEXIDINE GLUCONATE 0.12 % MT SOLN
15.0000 mL | Freq: Once | OROMUCOSAL | Status: AC
  Administered 2024-03-21: 15 mL via OROMUCOSAL

## 2024-03-21 MED ORDER — TRANEXAMIC ACID-NACL 1000-0.7 MG/100ML-% IV SOLN
INTRAVENOUS | Status: AC
  Filled 2024-03-21: qty 100

## 2024-03-21 MED ORDER — BUPIVACAINE-EPINEPHRINE (PF) 0.5% -1:200000 IJ SOLN
INTRAMUSCULAR | Status: AC
  Filled 2024-03-21: qty 30

## 2024-03-21 MED ORDER — ORAL CARE MOUTH RINSE: 15.0000 mL | Freq: Once | OROMUCOSAL | Status: AC

## 2024-03-21 MED ORDER — DEXAMETHASONE SODIUM PHOSPHATE 10 MG/ML IJ SOLN
INTRAMUSCULAR | Status: AC
Start: 2024-03-21 — End: 2024-03-21
  Filled 2024-03-21: qty 1

## 2024-03-21 MED ORDER — MIDAZOLAM HCL 2 MG/2ML IJ SOLN
INTRAMUSCULAR | Status: AC
Start: 2024-03-21 — End: 2024-03-21
  Filled 2024-03-21: qty 2

## 2024-03-21 MED ORDER — ONDANSETRON HCL 4 MG/2ML IJ SOLN
INTRAMUSCULAR | Status: AC
  Filled 2024-03-21: qty 2

## 2024-03-21 MED ORDER — CHLORHEXIDINE GLUCONATE 0.12 % MT SOLN
OROMUCOSAL | Status: AC
Start: 2024-03-21 — End: 2024-03-21
  Filled 2024-03-21: qty 15

## 2024-03-21 MED ORDER — SEVOFLURANE IN SOLN
RESPIRATORY_TRACT | Status: AC
Start: 2024-03-21 — End: 2024-03-21
  Filled 2024-03-21: qty 250

## 2024-03-21 MED ORDER — LIDOCAINE HCL (PF) 2 % IJ SOLN
INTRAMUSCULAR | Status: AC
  Filled 2024-03-21: qty 5

## 2024-03-21 MED ORDER — PHENYLEPHRINE HCL-NACL 20-0.9 MG/250ML-% IV SOLN
INTRAVENOUS | Status: AC
  Filled 2024-03-21: qty 250

## 2024-03-21 MED ORDER — CEFAZOLIN SODIUM-DEXTROSE 2-4 GM/100ML-% IV SOLN: 2.0000 g | INTRAVENOUS | Status: DC

## 2024-03-21 NOTE — H&P (Signed)
 Apparently, the patient had a mini stroke in early June and had an episode of chest pain 3 to 4 weeks ago.  He did see the cardiologist on 03/14/2024 who feels that this needs to be worked up further.  The patient is scheduled for an echocardiogram on 04/01/2024.  The anesthesiologist feels that it is important for the patient to obtain cardiology clearance before surgery.  In addition, he would prefer that the patient's surgery be delayed at least 3 months after his mini-stroke in order to decrease the risk of a recurrent stroke.  Therefore, the patient's surgery has been canceled for today.

## 2024-04-19 ENCOUNTER — Other Ambulatory Visit: Payer: Self-pay | Admitting: Cardiovascular Disease

## 2024-04-19 DIAGNOSIS — R9439 Abnormal result of other cardiovascular function study: Secondary | ICD-10-CM

## 2024-04-23 ENCOUNTER — Encounter (HOSPITAL_COMMUNITY): Payer: Self-pay

## 2024-04-24 ENCOUNTER — Telehealth (HOSPITAL_COMMUNITY): Payer: Self-pay | Admitting: *Deleted

## 2024-04-24 NOTE — Telephone Encounter (Signed)
Reaching out to patient to offer assistance regarding upcoming cardiac imaging study; pt verbalizes understanding of appt date/time, parking situation and where to check in, pre-test NPO status  and verified current allergies; name and call back number provided for further questions should they arise ? ?Adalaya Irion RN Navigator Cardiac Imaging ?[facility] ?[phone] office ?[phone] cell ? ?

## 2024-04-25 ENCOUNTER — Ambulatory Visit: Admission: RE | Admit: 2024-04-25 | Source: Ambulatory Visit

## 2024-05-08 ENCOUNTER — Telehealth (HOSPITAL_COMMUNITY): Payer: Self-pay | Admitting: *Deleted

## 2024-05-08 NOTE — Telephone Encounter (Signed)
 Attempted to call patient regarding upcoming cardiac CT appointment. Left message on voicemail with name and callback number Sid Seats RN Navigator Cardiac Imaging [facility] [phone] Office

## 2024-05-09 ENCOUNTER — Ambulatory Visit: Admission: RE | Admit: 2024-05-09 | Source: Ambulatory Visit

## 2024-05-10 ENCOUNTER — Telehealth (HOSPITAL_COMMUNITY): Payer: Self-pay | Admitting: *Deleted

## 2024-05-10 NOTE — Telephone Encounter (Signed)
 Attempted to call patient regarding upcoming cardiac CT appointment. Left message on voicemail with name and callback number Sid Seats RN Navigator Cardiac Imaging [facility] [phone] Office

## 2024-05-13 ENCOUNTER — Ambulatory Visit
Admission: RE | Admit: 2024-05-13 | Discharge: 2024-05-13 | Disposition: A | Discharge status: Home / Self Care | Source: Ambulatory Visit | Attending: Cardiovascular Disease | Admitting: Cardiovascular Disease

## 2024-05-13 DIAGNOSIS — K2289 Other specified disease of esophagus: Secondary | ICD-10-CM | POA: Diagnosis not present

## 2024-05-13 DIAGNOSIS — J439 Emphysema, unspecified: Secondary | ICD-10-CM | POA: Insufficient documentation

## 2024-05-13 DIAGNOSIS — R9439 Abnormal result of other cardiovascular function study: Secondary | ICD-10-CM | POA: Diagnosis present

## 2024-05-13 DIAGNOSIS — I2510 Atherosclerotic heart disease of native coronary artery without angina pectoris: Secondary | ICD-10-CM | POA: Insufficient documentation

## 2024-05-13 MED ORDER — NITROGLYCERIN 0.4 MG SL SUBL
0.8000 mg | SUBLINGUAL_TABLET | Freq: Once | SUBLINGUAL | Status: AC
  Administered 2024-05-13: 0.8 mg via SUBLINGUAL
  Filled 2024-05-13: qty 25

## 2024-05-13 MED ORDER — IOHEXOL 350 MG/ML SOLN
100.0000 mL | Freq: Once | INTRAVENOUS | Status: AC | PRN
  Administered 2024-05-13: 100 mL via INTRAVENOUS

## 2024-05-13 NOTE — Progress Notes (Signed)
 Patient tolerated CT well. Vital signs stable encourage to drink water throughout day.Reasons explained and verbalized understanding. Ambulated steady gait.

## 2024-06-12 ENCOUNTER — Encounter
Admission: RE | Admit: 2024-06-12 | Discharge: 2024-06-12 | Disposition: A | Discharge status: Home / Self Care | Source: Ambulatory Visit | Attending: Surgery | Admitting: Surgery

## 2024-06-12 ENCOUNTER — Other Ambulatory Visit: Payer: Self-pay

## 2024-06-12 ENCOUNTER — Other Ambulatory Visit: Payer: Self-pay | Admitting: Surgery

## 2024-06-12 DIAGNOSIS — I10 Essential (primary) hypertension: Secondary | ICD-10-CM | POA: Diagnosis not present

## 2024-06-12 DIAGNOSIS — Z01818 Encounter for other preprocedural examination: Secondary | ICD-10-CM | POA: Diagnosis present

## 2024-06-12 DIAGNOSIS — Z01810 Encounter for preprocedural cardiovascular examination: Secondary | ICD-10-CM | POA: Diagnosis not present

## 2024-06-12 LAB — URINALYSIS, ROUTINE W REFLEX MICROSCOPIC
Bacteria, UA: NONE SEEN
Bilirubin Urine: NEGATIVE
Glucose, UA: NEGATIVE mg/dL
Ketones, ur: NEGATIVE mg/dL
Leukocytes,Ua: NEGATIVE
Nitrite: NEGATIVE
Protein, ur: NEGATIVE mg/dL
Specific Gravity, Urine: 1.01 (ref 1.005–1.030)
Squamous Epithelial / HPF: 0 /HPF (ref 0–5)
pH: 5 (ref 5.0–8.0)

## 2024-06-12 LAB — CBC WITH DIFFERENTIAL/PLATELET
Abs Immature Granulocytes: 0.04 K/uL (ref 0.00–0.07)
Basophils Absolute: 0.1 K/uL (ref 0.0–0.1)
Basophils Relative: 1 %
Eosinophils Absolute: 0.2 K/uL (ref 0.0–0.5)
Eosinophils Relative: 4 %
HCT: 40.3 % (ref 39.0–52.0)
Hemoglobin: 13.5 g/dL (ref 13.0–17.0)
Immature Granulocytes: 1 %
Lymphocytes Relative: 35 %
Lymphs Abs: 2.4 K/uL (ref 0.7–4.0)
MCH: 32.2 pg (ref 26.0–34.0)
MCHC: 33.5 g/dL (ref 30.0–36.0)
MCV: 96.2 fL (ref 80.0–100.0)
Monocytes Absolute: 0.6 K/uL (ref 0.1–1.0)
Monocytes Relative: 9 %
Neutro Abs: 3.5 K/uL (ref 1.7–7.7)
Neutrophils Relative %: 50 %
Platelets: 233 K/uL (ref 150–400)
RBC: 4.19 MIL/uL — ABNORMAL LOW (ref 4.22–5.81)
RDW: 12.6 % (ref 11.5–15.5)
WBC: 6.8 K/uL (ref 4.0–10.5)
nRBC: 0 % (ref 0.0–0.2)

## 2024-06-12 LAB — COMPREHENSIVE METABOLIC PANEL WITH GFR
ALT: 11 U/L (ref 0–44)
AST: 17 U/L (ref 15–41)
Albumin: 3.7 g/dL (ref 3.5–5.0)
Alkaline Phosphatase: 78 U/L (ref 38–126)
Anion gap: 9 (ref 5–15)
BUN: 11 mg/dL (ref 8–23)
CO2: 25 mmol/L (ref 22–32)
Calcium: 8.8 mg/dL — ABNORMAL LOW (ref 8.9–10.3)
Chloride: 105 mmol/L (ref 98–111)
Creatinine, Ser: 0.65 mg/dL (ref 0.61–1.24)
GFR, Estimated: >60 mL/min (ref >60)
Glucose, Bld: 89 mg/dL (ref 70–99)
Potassium: 3.4 mmol/L — ABNORMAL LOW (ref 3.5–5.1)
Sodium: 139 mmol/L (ref 135–145)
Total Bilirubin: 0.5 mg/dL (ref 0.0–1.2)
Total Protein: 6.5 g/dL (ref 6.5–8.1)

## 2024-06-12 NOTE — Patient Instructions (Addendum)
 SURGERY DATE: TUESDAY NOVEMBER 4   CALL:  [phone]  between 1-3  MONDAY NOVEMBER 3 for time   Do not eat food after midnight the night before surgery.   DRINK ENSURE :  NIGHT BEFORE  NO SMOKING 24 hours before  [facility] WITH SOAP NIGHT BEFORE, no head, face, private  Brush teeth before coming          Your procedure is scheduled on:  TUESDAY NOVEMBER 4  Report to the Registration Desk on the 1st floor of the [facility] To find out your arrival time, please call [phone] between 1PM - 3PM on:    MONDAY NOVEMBER 3  If your arrival time is 6:00 am, do not arrive before that time as the [facility] entrance doors do not open until 6:00 am.  REMEMBER: Instructions that are not followed completely may result in serious medical risk, up to and including death; or upon the discretion of your surgeon and anesthesiologist your surgery may need to be rescheduled.  Do not eat food after midnight the night before surgery.  No gum chewing or hard candies.  You may however, drink CLEAR liquids up to 2 hours before you are scheduled to arrive for your surgery. Do not drink anything within 2 hours of your scheduled arrival time.  Clear liquids include: - water  - apple juice without pulp - gatorade (not RED colors) - black coffee or tea (Do NOT add milk or creamers to the coffee or tea) Do NOT drink anything that is not on this list.  In addition, your doctor has ordered for you to drink the provided:  Ensure Pre-Surgery Clear Carbohydrate Drink   Drinking this carbohydrate drink up to two hours before surgery helps to reduce insulin resistance and improve patient outcomes. Please complete drinking 2 hours before scheduled arrival time.  One week prior to surgery: Stop Anti-inflammatories (NSAIDS) such as Advil , Aleve, Ibuprofen , Motrin , Naproxen, Naprosyn and Aspirin based products such as Excedrin, Goody's Powder, BC Powder. Stop ANY OVER THE COUNTER supplements until  after surgery.  You may however, continue to take Tylenol  if needed for pain up until the day of surgery.   Continue taking all of your other prescription medications up until the day of surgery.  ON THE DAY OF SURGERY DO NOT TAKE ANY MEDICATIONS  No Alcohol for 24 hours before or after surgery.  No Smoking including e-cigarettes for 24 hours before surgery.  No chewable tobacco products for at least 6 hours before surgery.  No nicotine patches on the day of surgery.  Do not use any recreational drugs for at least a week (preferably 2 weeks) before your surgery.  Please be advised that the combination of cocaine and anesthesia may have negative outcomes, up to and including death. If you test positive for cocaine, your surgery will be cancelled.  On the morning of surgery brush your teeth with toothpaste and water, you may rinse your mouth with mouthwash if you wish. Do not swallow any toothpaste or mouthwash.  Use CHG Soap as directed on instruction sheet.  Do not wear jewelry, make-up, hairpins, clips or nail polish.  For welded (permanent) jewelry: bracelets, anklets, waist bands, etc.  Please have this removed prior to surgery.  If it is not removed, there is a chance that hospital personnel will need to cut it off on the day of surgery.  Do not wear lotions, powders, or perfumes.   Do not shave body hair from the neck  down 48 hours before surgery.  Contact lenses, hearing aids and dentures may not be worn into surgery.  Do not bring valuables to the hospital. [facility] is not responsible for any missing/lost belongings or valuables.   Notify your doctor if there is any change in your medical condition (cold, fever, infection).  Wear comfortable clothing (specific to your surgery type) to the hospital.  After surgery, you can help prevent lung complications by doing breathing exercises.  Take deep breaths and cough every 1-2 hours. Your doctor may order a device  called an Incentive Spirometer to help you take deep breaths.  If you are being discharged the day of surgery, you will not be allowed to drive home. You will need a responsible individual to drive you home and stay with you for 24 hours after surgery.   If you are taking public transportation, you will need to have a responsible individual with you.  Please call the Pre-admissions Testing Dept. at [phone] if you have any questions about these instructions.  Surgery Visitation Policy:  Patients having surgery or a procedure may have two visitors.  Children under the age of 42 must have an adult with them who is not the patient.   Merchandiser, Retail to address health-related social needs:  [email or web address]                                                                                                               Preparing for Surgery with CHLORHEXIDINE  GLUCONATE (CHG) Soap  Chlorhexidine  Gluconate (CHG) Soap  o An antiseptic cleaner that kills germs and bonds with the skin to continue killing germs even after washing  o Used for showering the night before surgery and morning of surgery  Before surgery, you can play an important role by reducing the number of germs on your skin.  CHG (Chlorhexidine  gluconate) soap is an antiseptic cleanser which kills germs and bonds with the skin to continue killing germs even after washing.  Please do not use if you have an allergy to CHG or antibacterial soaps. If your skin becomes reddened/irritated stop using the CHG.  1. Shower the NIGHT BEFORE SURGERY with CHG soap.  2. If you choose to wash your hair, wash your hair first as usual with your normal shampoo.  3. After shampooing, rinse your hair and body thoroughly to remove the shampoo.  4. Use CHG as you would any other liquid soap. You can apply CHG directly to the skin and wash gently with a clean washcloth.  5. Apply the CHG soap to your body only  from the neck down. Do not use on open wounds or open sores. Avoid contact with your eyes, ears, mouth, and genitals (private parts). Wash face and genitals (private parts) with your normal soap.  6. Wash thoroughly, paying special attention to the area where your surgery will be performed.  7. Thoroughly rinse your body with warm water.  8. Do not shower/wash with your normal soap after using and rinsing off the CHG  soap.  9. Do not use lotions, oils, etc., after showering with CHG.  10. Pat yourself dry with a clean towel.  11. Wear clean pajamas to bed the night before surgery.  12. Place clean sheets on your bed the night of your shower and do not sleep with pets.  13. Do not apply any deodorants/lotions/powders.  14. Please wear clean clothes to the hospital.  15. Remember to brush your teeth with your regular toothpaste.

## 2024-06-13 NOTE — Progress Notes (Signed)
 NAME: Tommy Avila  H&P Date: 06/13/2024   Procedure Date: 06/18/2024  Chief Complaint: right shoulder pain  HPI  Tommy Avila is a 73 y.o. male who has severe Right shoulder pain.  Patient reports ongoing right shoulder pain that has previously been evaluated by Gustavo Level.  The patient underwent an MRI scan that showed moderate underlying tendinosis with potential tearing of the rotator cuff.  He continues to endorse pain that localizes along the lateral and posterior margins of his right shoulder.  He denies having any numbness, tingling or radiation symptoms down his arm.  It severely limits his ability to perform his ADLs and use his arm as he would like to.  Patient is noted to have a history of a stroke and other cardiac abnormalities and was previously scheduled for surgery that was canceled secondary to needing updated cardiac evaluation.  He states that he has seen his cardiologist and is ready to proceed with surgery. He has failed conservative treatment including tylenol , injections and NSAIDs.   He has requested operative intervention for relief of his symptoms.  Patient denies any previous cardiac history, but underwent a recent CT angiogram which seemed to result without any issue.  He does report history of COPD.  No previous DVTs or clots.  He is not diabetic.  No previous surgeries on his right shoulder although does report multiple left shoulder operations.  Social Hx: Patient lives at home alone, but states he will be having his knees look after him postoperatively.  He works for an [employer] and [employer] and [employer].  He admits to about 1 standard drink a week.  Denies any illicit drug use.  Does admit to smoking, states he smokes about 1/2 pack a week.   Medications & Allergies  Allergies: Allergies  Allergen Reactions  . Oxycodone Hallucination and Other (See Comments)    Hallucinations    Home Medicines: Current Outpatient Medications on File Prior  to Visit  Medication Sig Dispense Refill  . acetaminophen  (TYLENOL ) 500 MG tablet Take by mouth    . albuterol  90 mcg/actuation inhaler Inhale 2 inhalations into the lungs every 4 (four) hours as needed.    SABRA aspirin 81 MG EC tablet Take 81 mg by mouth once daily.    SABRA atorvastatin (LIPITOR) 20 MG tablet TAKE 1 TABLET BY MOUTH AT BEDTIME FOR HIGH CHOLESTEROL    . beclomethasone (QVAR) 80 mcg/actuation inhaler Inhale into the lungs 2 (two) times daily.    . calcium  carbonate 500 mg calcium  (1,250 mg) tablet Take 1 tablet by mouth at bedtime    . celecoxib  (CELEBREX ) 200 MG capsule TAKE 1 CAPSULE BY MOUTH TWICE A DAY 60 capsule 0  . HYDROcodone -acetaminophen  (NORCO) 5-325 mg tablet Take 0.5-1 tablets by mouth every 8 (eight) hours as needed for Pain 15 tablet 0  . lidocaine  2 % solution Use as directed 15 mLs in the mouth or throat every 6 (six) hours as needed for mouth pain.    . meclizine (ANTIVERT) 12.5 mg tablet Take 1 tablet (12.5 mg total) by mouth 3 (three) times daily as needed for Dizziness 30 tablet 0  . montelukast (SINGULAIR) 10 mg tablet Take 10 mg by mouth at bedtime    . potassium chloride  (KLOR-CON ) 20 MEQ ER tablet     . tiotropium (SPIRIVA WITH HANDIHALER) 18 mcg inhalation capsule      No current facility-administered medications on file prior to visit.    Medical / Surgical History  Past Medical History:  Diagnosis Date  . COPD (chronic obstructive pulmonary disease) (CMS/HHS-HCC)   . Knee pain   . Neck pain   . 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 Psoriasis (a type of skin inflammation)   . Sensory ataxia   . Shoulder joint pain      Past Surgical History:  Procedure Laterality Date  . Right total hip arthroplasty Right 11/02/2021   Dr.Poggi  . Left total hip arthroplasty. Left 09/01/2022   Dr.Poggi  . back surgery    . EXCISION GANGLION CYST WRIST PRIMARY       Physical Exam   Ht:  Wt:  BMI: There is no height or  weight on file to calculate BMI.  General/Constitutional: No apparent distress: well-nourished and well developed. Eyes: Pupils equal, round with synchronous movement. Lymphatic: No palpable adenopathy. Respiratory: Patient has good chest rise and fall with inspiration and expiration.  All lung fields are clear to auscultation bilaterally.  There is no Rales, rhonchi or wheezes appreciated. Cardiovascular: Upon auscultation there is a regular rate and rhythm without any murmurs, rubs, gallops or heaves appreciated.  There does not appear to be any swelling down the lower extremities.  Posterior tibial pulses appreciated bilaterally, 2+. Integumentary: No impressive skin lesions present, except as noted in detailed exam. Neuro/Psych: Normal mood and affect, oriented to person, place and time. Musculoskeletal: see exam below  Right Shoulder:  Upon inspection of the patient's right shoulder there is no noticeable swelling, erythema, ecchymosis or gross deformity appreciated.  No tenting of the skin noted.  Shoulder contour: Normal Tenderness:  Subacromial tenderness. Impingement test: positive Apprehension sign: negative Crepitance:  negative Atrophy:  No atrophy. Limited strength with both internal and external rotation of the shoulder against resistance. Liftoff test:  Performed with difficulty Empty can test: Elicits a fair amount of discomfort with 4/5 strength Speed's Test:  Elicits discomfort with fair strength Range of Motion:  EXT/FLEX: -/100     ADD/ABD: -/90    IR/ER at 90 degrees abduction: 60/70   Patient is neurovascularly intact all dermatomes extending down their right upper extremity and into their hand and fingers.  Radial pulses appreciated, 2+.  Cap refill less than 2 seconds in each finger.  Imaging  Imaging: None ordered today.  Previous MRI results and images were reviewed from 03/14/2024.  See findings below:  FINDINGS:  Rotator cuff: Moderate-severe  supraspinatus tendinosis with fraying  along the anterior bursal surface. Moderate infraspinatus  tendinosis. Infraspinatus tendon is intact. Teres minor tendon is  intact. Mild subscapularis tendinosis.   Muscles: No muscle atrophy or edema. No intramuscular fluid  collection or hematoma.   Biceps Long Head: Moderate tendinosis of the intra-articular portion  of the long head of the biceps tendon.   Acromioclavicular Joint: Moderate arthropathy of the  acromioclavicular joint. Small amount of subacromial/subdeltoid  bursal fluid.   Glenohumeral Joint: No joint effusion. No chondral defect.   Labrum: Grossly intact, but evaluation is limited by lack of  intraarticular fluid/contrast.   Bones: No fracture or dislocation. No aggressive osseous lesion.  Subcortical cystic changes at the infraspinatus insertion.   Other: No fluid collection or hematoma.   IMPRESSION:  1. Moderate-severe supraspinatus tendinosis with fraying along the  anterior bursal surface.  2. Moderate infraspinatus tendinosis.  3. Mild subscapularis tendinosis.  4. Moderate tendinosis of the intra-articular portion of the long  head of the biceps tendon.  5. Mild subacromial/subdeltoid bursitis.  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  Consents has been signed.  The risks, benefits, prognosis and alternatives including but not limited to DVT, PE, infection, neurovascular injury, failure of the procedure and death were explained to the patient and he is willing to proceed with surgery as described to him by myself.  Plan will be for the patient to be discharged on same day of surgery.  He will be managed with DVT prophylaxis, antibiotics preoperatively, routine outpatient follow-up and outpatient PT.  Pre, intra and  post op interventions were discussed. Patient has good understanding   Medication Reconciliation was performed. Discussed cessation of NSAIDs, vitamins and supplements.    A total of 40 minutes was spent reviewing patient's charts, medical reconciliation, discussing/educating the patient about surgical interventions, and answering any questions provided by the patient.  JOSHUA DALLAS KOYANAGI, [place] [facility] 06/13/2024

## 2024-06-14 ENCOUNTER — Encounter: Payer: Self-pay | Admitting: Surgery

## 2024-06-17 ENCOUNTER — Encounter: Payer: Self-pay | Admitting: Surgery

## 2024-06-17 MED ORDER — ORAL CARE MOUTH RINSE: 15.0000 mL | Freq: Once | OROMUCOSAL | Status: AC

## 2024-06-17 MED ORDER — CEFAZOLIN SODIUM-DEXTROSE 2-4 GM/100ML-% IV SOLN
2.0000 g | INTRAVENOUS | Status: AC
  Administered 2024-06-18: 2 g via INTRAVENOUS

## 2024-06-17 MED ORDER — CHLORHEXIDINE GLUCONATE 0.12 % MT SOLN
15.0000 mL | Freq: Once | OROMUCOSAL | Status: AC
  Administered 2024-06-18: 15 mL via OROMUCOSAL

## 2024-06-17 MED ORDER — LACTATED RINGERS IV SOLN: INTRAVENOUS | Status: DC

## 2024-06-17 NOTE — Progress Notes (Signed)
 Perioperative / Anesthesia Services  Pre-Admission Testing Clinical Review / Pre-Operative Anesthesia Consult  Date: 06/17/24  PATIENT DEMOGRAPHICS: Name: Tommy Avila DOB: 05-30-1951 MRN:   [id number]  Note: Available PAT nursing documentation and vital signs have been reviewed. Clinical nursing staff has updated patient's PMH/PSHx, current medication list, and drug allergies/intolerances to ensure complete and comprehensive history available to assist care teams in MDM as it pertains to the aforementioned surgical procedure and anticipated anesthetic course. Extensive review of available clinical information personally performed. Nursing documentation reviewed. [place] PMH and PSHx updated with any diagnoses and/or procedures that I have knowledge of that may have been inadvertently omitted during his intake with the pre-admission testing department's nursing staff.  PLANNED SURGICAL PROCEDURE(S):   Case: [id number] Date/Time: 06/18/24 1258   Procedures:      ARTHROSCOPY, SHOULDER WITH DEBRIDEMENT (Right: Shoulder)     DECOMPRESSION, SUBACROMIAL SPACE (Right: Shoulder)     REPAIR, ROTATOR CUFF, OPEN (Right: Shoulder)     TENODESIS, BICEPS (Right: Shoulder)   Anesthesia type: Choice   Diagnosis:      Right rotator cuff tendonitis [M75.81]     Biceps tendonitis on right [M75.21]     Impingement of right shoulder [M25.811]     Chronic right shoulder pain [M25.511, G89.29]   Pre-op diagnosis:      Right rotator cuff tendonitis M75.81     Biceps tendonitis on right M75.21     Impingement of right shoulder M25.811     Chronic right shoulder pain M25.511, G89.29   Location: [facility] OR ROOM 02 / [facility]   Surgeons: Edie Norleen PARAS, MD        CLINICAL DISCUSSION: Tommy Avila is a 73 y.o. male who is submitted for pre-surgical anesthesia review and clearance prior to him undergoing the above procedure. Patient is a Current Smoker. Pertinent PMH includes: CAD,  CVA, BILATERAL carotid artery stenosis, LEFT vertebral artery stenosis, HLD, COPD, LEFT inguinal hernia, OA, sensory ataxia.  Patient is followed by cardiology Edra, MD). He was last seen in the cardiology clinic on 04/22/2024; notes reviewed. At the time of his clinic visit, patient doing well overall from a cardiovascular perspective. Patient denied any chest pain, shortness of breath, PND, orthopnea, palpitations, significant peripheral edema, weakness, fatigue, vertiginous symptoms, or presyncope/syncope. Patient with a past medical history significant for cardiovascular diagnoses. Documented physical exam was grossly benign, providing no evidence of acute exacerbation and/or decompensation of the patient's known cardiovascular conditions.  MRI imaging of the brain performed on 12/31/2023 revealed punctate acute/subacute cortical infarct within the LEFT parietal lobe. CT angiography study of the head and neck revealed a 40% stenosis of the proximal LICA. Patient has no significant residual deficits following neurological event.   Patient underwent stress echocardiogram on 04/09/2024.  Study demonstrated a low normal left ventricular systolic function with an EF of 50%.  Patient able to achieve 7.2 METS of physical activity, however target heart rate was not achieved.  There were no wall motion abnormalities.  There was trivial paravalvular regurgitation.  Blood pressure well controlled at 114/60 mmHg; takes no antihypertensives.  Patient is on atorvastatin for his HLD diagnosis and ASCVD prevention. Patient is not diabetic. He does not have an OSAH diagnosis. Patient is able to complete all of his  ADL/IADLs without cardiovascular limitation.  Per the [facility], patient is able to achieve at least 4 METS of physical activity without experiencing any significant degree of angina/anginal equivalent symptoms. No changes were made  to his medication regimen during his visit with cardiology.  Given patient's  known history of coronary calcifications on CT, cardiac CTA was recommended to rule out obstructive epicardial disease.  Patient scheduled to follow-up with outpatient cardiology in following cardiac CTA for further evaluation and ongoing management of his known cardiovascular diagnoses.  Since patient was last seen by cardiology, he has undergone the recommended cardiovascular testing.  Coronary CTA was performed on 05/13/2024 that demonstrated an Agatston coronary artery calcium  score of 448. This placed patient in the 66th percentile for age, sex, and race matched controls. Calcium  depositions noted to be isolated mainly in the RCA, LCx, and LAD (25-49%) distributions.  Study demonstrated normal coronary origin with RIGHT dominance.  Tommy Avila is scheduled for an elective ARTHROSCOPY, SHOULDER WITH DEBRIDEMENT (Right: Shoulder); DECOMPRESSION, SUBACROMIAL SPACE (Right: Shoulder); REPAIR, ROTATOR CUFF, OPEN (Right: Shoulder); TENODESIS, BICEPS (Right: Shoulder) on 06/18/2024 with Dr. Norleen JINNY Maltos, MD. Given patient's past medical history significant for cardiovascular diagnoses, presurgical cardiac clearance was sought by the PAT team. Per cardiology, this patient is optimized for surgery and may proceed with the planned procedural course with a LOW risk of significant perioperative cardiovascular complications.  In review of the patient's medication reconciliation, it is noted that he is on daily oral antithrombotic therapy. Given that patient's past medical history is significant for cardiovascular diagnoses, including but not limited to CAD, orthopedics has cleared patient to continue his daily low dose ASA throughout his perioperative course.  Patient has been updated on these directives from his specialty care providers by the PAT team.  Patient denies previous perioperative complications with anesthesia in the past. In review his EMR, it is noted that patient underwent a general anesthetic  course here at [facility] ([facility]) in 08/2022 without documented complications.   MOST RECENT VITAL SIGNS:    06/12/2024    1:03 PM 06/12/2024    1:02 PM 05/13/2024    1:44 PM  Vitals with BMI  Height 6' 1    Weight  147 lbs 5 oz   Systolic 142  108  Diastolic 78  48  Pulse 62  64   PROVIDERS/SPECIALISTS: NOTE: Primary physician provider listed below. Patient may have been seen by APP or partner within same practice.   PROVIDER ROLE / SPECIALTY LAST OV  Poggi, Norleen JINNY, MD Orthopedics (Surgeon) 06/13/2024  Revelo, Yancy Roof, MD Primary Care Provider 01/15/2024  Hilarie Rocher, MD Cardiology 04/22/2024   ALLERGIES: Allergies  Allergen Reactions   Oxycodone Other (See Comments)    Hallucinations    CURRENT HOME MEDICATIONS: No current facility-administered medications for this encounter.    aspirin EC 81 MG tablet   atorvastatin (LIPITOR) 20 MG tablet   HYDROcodone -acetaminophen  (NORCO/VICODIN) 5-325 MG tablet   HISTORY: Past Medical History:  Diagnosis Date   Aortic atherosclerosis    Arthritis    Bilateral carotid artery disease    CAD (coronary artery disease)    COPD (chronic obstructive pulmonary disease) (HCC)    Dyspnea    High cholesterol    History of 2019 novel coronavirus disease (COVID-19) 10/13/2021   a.) tested (+) via home test.   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   a.) punctate acute/subacute cortical infarct within the LEFT parietal lobe   Vertebral artery stenosis,  left    Past Surgical History:  Procedure Laterality Date   BACK SURGERY     lumbar bulging disc   COLONOSCOPY WITH PROPOFOL  N/A 06/15/2021   Procedure: COLONOSCOPY WITH PROPOFOL ;  Surgeon: Jinny Carmine, MD;  Location: [facility] ENDOSCOPY;  Service: Endoscopy;  Laterality: N/A;   GANGLION CYST EXCISION      wrist   INGUINAL HERNIA REPAIR Left 05/22/2019   Procedure: HERNIA REPAIR INGUINAL ADULT;  Surgeon: Marolyn Nest, MD;  Location: [facility] ORS;  Service: General;  Laterality: Left;   SHOULDER SURGERY Left    x5   TOTAL HIP ARTHROPLASTY Right 11/02/2021   Procedure: TOTAL HIP ARTHROPLASTY;  Surgeon: Edie Norleen PARAS, MD;  Location: [facility] ORS;  Service: Orthopedics;  Laterality: Right;   TOTAL HIP ARTHROPLASTY Left 09/01/2022   Procedure: TOTAL HIP ARTHROPLASTY;  Surgeon: Edie Norleen PARAS, MD;  Location: [facility] ORS;  Service: Orthopedics;  Laterality: Left;   TOTAL SHOULDER ARTHROPLASTY Left    Family History  Problem Relation Age of Onset   Heart attack Mother    Emphysema Sister    Prostate cancer Brother    Social History   Tobacco Use   Smoking status: Every Day    Current packs/day: 0.50    Types: Cigarettes   Smokeless tobacco: Never  Substance Use Topics   Alcohol use: Yes    Comment: occ   LABS:  Hospital Outpatient Visit on 06/12/2024  Component Date Value Ref Range Status   WBC 06/12/2024 6.8  4.0 - 10.5 K/uL Final   RBC 06/12/2024 4.19 (L)  4.22 - 5.81 MIL/uL Final   Hemoglobin 06/12/2024 13.5  13.0 - 17.0 g/dL Final   HCT 89/70/7974 40.3  39.0 - 52.0 % Final   MCV 06/12/2024 96.2  80.0 - 100.0 fL Final   MCH 06/12/2024 32.2  26.0 - 34.0 pg Final   MCHC 06/12/2024 33.5  30.0 - 36.0 g/dL Final   RDW 89/70/7974 12.6  11.5 - 15.5 % Final   Platelets 06/12/2024 233  150 - 400 K/uL Final   nRBC 06/12/2024 0.0  0.0 - 0.2 % Final   Neutrophils Relative % 06/12/2024 50  % Final   Neutro Abs 06/12/2024 3.5  1.7 - 7.7 K/uL Final   Lymphocytes Relative 06/12/2024 35  % Final   Lymphs Abs 06/12/2024 2.4  0.7 - 4.0 K/uL Final   Monocytes Relative 06/12/2024 9  % Final   Monocytes Absolute 06/12/2024 0.6  0.1 - 1.0 K/uL Final   Eosinophils Relative 06/12/2024 4  % Final   Eosinophils Absolute 06/12/2024 0.2  0.0 - 0.5 K/uL Final   Basophils Relative 06/12/2024 1  % Final    Basophils Absolute 06/12/2024 0.1  0.0 - 0.1 K/uL Final   Immature Granulocytes 06/12/2024 1  % Final   Abs Immature Granulocytes 06/12/2024 0.04  0.00 - 0.07 K/uL Final   Performed at [facility], [address] [place], [place] [address]   Sodium 06/12/2024 139  135 - 145 mmol/L Final   Potassium 06/12/2024 3.4 (L)  3.5 - 5.1 mmol/L Final   Chloride 06/12/2024 105  98 - 111 mmol/L Final   CO2 06/12/2024 25  22 - 32 mmol/L Final   Glucose, Bld 06/12/2024 89  70 - 99 mg/dL Final   Glucose reference range applies only to samples taken after fasting for at least 8 hours.   BUN 06/12/2024 11  8 - 23 mg/dL Final   Creatinine, Ser 06/12/2024 0.65  0.61 - 1.24  mg/dL Final   Calcium  06/12/2024 8.8 (L)  8.9 - 10.3 mg/dL Final   Total Protein 89/70/7974 6.5  6.5 - 8.1 g/dL Final   Albumin 89/70/7974 3.7  3.5 - 5.0 g/dL Final   AST 89/70/7974 17  15 - 41 U/L Final   ALT 06/12/2024 11  0 - 44 U/L Final   Alkaline Phosphatase 06/12/2024 78  38 - 126 U/L Final   Total Bilirubin 06/12/2024 0.5  0.0 - 1.2 mg/dL Final   GFR, Estimated 06/12/2024 >60  >60 mL/min Final   Comment: (NOTE) Calculated using the CKD-EPI Creatinine Equation (2021)    Anion gap 06/12/2024 9  5 - 15 Final   Performed at [facility], [address] [place], [place] [address]   Color, Urine 06/12/2024 YELLOW (A)  YELLOW Final   APPearance 06/12/2024 CLEAR (A)  CLEAR Final   Specific Gravity, Urine 06/12/2024 1.010  1.005 - 1.030 Final   pH 06/12/2024 5.0  5.0 - 8.0 Final   Glucose, UA 06/12/2024 NEGATIVE  NEGATIVE mg/dL Final   Hgb urine dipstick 06/12/2024 SMALL (A)  NEGATIVE Final   Bilirubin Urine 06/12/2024 NEGATIVE  NEGATIVE Final   Ketones, ur 06/12/2024 NEGATIVE  NEGATIVE mg/dL Final   Protein, ur 89/70/7974 NEGATIVE  NEGATIVE mg/dL Final   Nitrite 89/70/7974 NEGATIVE  NEGATIVE Final   Leukocytes,Ua 06/12/2024 NEGATIVE  NEGATIVE Final   RBC / HPF 06/12/2024 0-5  0 - 5 RBC/hpf Final   WBC, UA  06/12/2024 0-5  0 - 5 WBC/hpf Final   Bacteria, UA 06/12/2024 NONE SEEN  NONE SEEN Final   Squamous Epithelial / HPF 06/12/2024 0  0 - 5 /HPF Final   Performed at [facility], [address] [place], [place] [address]    ECG: Date: 10/929/2025 Time ECG obtained: 1402 PM Rate: 52 bpm Rhythm: sinus bradycardia Axis (leads I and aVF): normal Intervals: PR 164 ms. QRS 100 ms. QTc 411 ms. ST segment and T wave changes: No evidence of acute T wave abnormalities or significant ST segment elevation or depression.  Evidence of a possible, age undetermined, prior infarct:  No Comparison: Similar to previous tracing obtained on 12/31/2023  IMAGING / PROCEDURES: CT CORONARY MORPH W/CTA COR W/SCORE W/CA W/CM &/OR WO/CM performed on 05/13/2024 Mild symmetric distal esophageal wall thickening, which can be seen in the setting of esophagitis. Mild diffuse bronchial wall thickening, which can be seen in the setting of bronchitis. Emphysema. Coronary calcium  score of 448. This was 66th percentile for age and sex matched controls (MESA). Normal coronary origin with right dominance. Mild RCA, Cx and LAD stenosis (25-49%). CAD-RADS 2. Mild non-obstructive CAD (25-49%). Preventive therapy and risk factor modification.  ECHO STRESS TEST performed on 04/09/2024 Abnormal stress echocardiogram.  Normal resting study with no wall motion abnormalities at sub-target hr.  Maximum workload of 7.2 METs was achieved during exercise.  No wall motion abnormalities at rest and stress, target hr not achieved  Normal la pressures with normal diastolic function  Trivial AR, MR, TR, PR No valvular stenosis   MR SHOULDER RIGHT WO CONTRAST performed on 03/14/2024 Moderate-severe supraspinatus tendinosis with fraying along the anterior bursal surface. Moderate infraspinatus tendinosis. Mild subscapularis tendinosis. Moderate tendinosis of the intra-articular portion of the long head of the biceps  tendon. Mild subacromial/subdeltoid bursitis.  MR BRAIN WO CONTRAST performed on 02/19/2024 No acute finding by MRI.  Moderate chronic small-vessel ischemic changes of the cerebral hemispheric white matter.  Old small vessel infarction of the  right cerebellum. Punctate focus of restricted diffusion in the left parietal cortex seen on the study of May is no longer visible. I do favor that this represented a tiny acute cortical infarction at the time of prior exam.  CT ANGIO HEAD NECK W WO CM performed on 12/31/2023 No acute intracranial finding. Parenchymal atrophy and chronic small vessel ischemic disease. Small chronic infarct within the right cerebellar hemisphere. Streak/beam hardening artifact partially obscures the proximal left common carotid artery. Within this limitation, the common carotid and internal carotid arteries are patent in the neck. Atherosclerotic plaque bilaterally, as described. Most notably, a plaque results in 40% stenosis of the proximal left ICA. Venous reflux of contrast partially obscures the left vertebral artery V1 segment. Within this limitation, the vertebral arteries are patent within the neck. Atherosclerotic plaque bilaterally with no more than mild stenosis. Aortic atherosclerosis  Emphysema     IMPRESSION AND PLAN: ASHOK SAWAYA has been referred for pre-anesthesia review and clearance prior to him undergoing the planned anesthetic and procedural courses. Available labs, pertinent testing, and imaging results were personally reviewed by me in preparation for upcoming operative/procedural course. [facility] medical record has been updated following extensive record review and patient interview with PAT staff.   This patient has been appropriately cleared by cardiology with an overall LOW risk of patient experiencing significant perioperative cardiovascular complications. Based on clinical review performed today (06/17/24), barring any significant acute  changes in the patient's overall condition, it is anticipated that he will be able to proceed with the planned surgical intervention. Any acute changes in clinical condition may necessitate his procedure being postponed and/or cancelled. Patient will meet with anesthesia team (MD and/or CRNA) on the day of his procedure for preoperative evaluation/assessment. Questions regarding anesthetic course will be fielded at that time.   Pre-surgical instructions were reviewed with the patient during his PAT appointment, and questions were fielded to satisfaction by PAT clinical staff. He has been instructed on which medications that he will need to hold prior to surgery, as well as the ones that have been deemed safe/appropriate to take on the day of his procedure. As part of the general education provided by PAT, patient made aware both verbally and in writing, that he would need to abstain from the use of any illegal substances during his perioperative course. He was advised that failure to follow the provided instructions could necessitate case cancellation or result in serious perioperative complications up to and including death. Patient encouraged to contact PAT and/or his surgeon's office to discuss any questions or concerns that may arise prior to surgery; verbalized understanding.   Dorise Pereyra, MSN, APRN, FNP-C, CEN [facility]  Perioperative Services Nurse Practitioner Phone: [phone] Fax: [phone] 06/17/24 1:31 PM  NOTE: This note has been prepared using Dragon dictation software. Despite my best ability to proofread, there is always the potential that unintentional transcriptional errors may still occur from this process.

## 2024-06-18 ENCOUNTER — Ambulatory Visit: Payer: Self-pay | Admitting: Urgent Care

## 2024-06-18 ENCOUNTER — Encounter: Payer: Self-pay | Admitting: Surgery

## 2024-06-18 ENCOUNTER — Other Ambulatory Visit: Payer: Self-pay

## 2024-06-18 ENCOUNTER — Ambulatory Visit

## 2024-06-18 ENCOUNTER — Ambulatory Visit
Admission: RE | Admit: 2024-06-18 | Discharge: 2024-06-18 | Disposition: A | Discharge status: Home / Self Care | Attending: Surgery | Admitting: Surgery

## 2024-06-18 ENCOUNTER — Encounter
Admission: RE | Disposition: A | Discharge status: Home / Self Care | Payer: Self-pay | Source: Home / Self Care | Attending: Surgery

## 2024-06-18 DIAGNOSIS — M75111 Incomplete rotator cuff tear or rupture of right shoulder, not specified as traumatic: Secondary | ICD-10-CM | POA: Insufficient documentation

## 2024-06-18 DIAGNOSIS — M25811 Other specified joint disorders, right shoulder: Secondary | ICD-10-CM | POA: Insufficient documentation

## 2024-06-18 DIAGNOSIS — I2510 Atherosclerotic heart disease of native coronary artery without angina pectoris: Secondary | ICD-10-CM | POA: Insufficient documentation

## 2024-06-18 DIAGNOSIS — M7581 Other shoulder lesions, right shoulder: Secondary | ICD-10-CM | POA: Diagnosis present

## 2024-06-18 DIAGNOSIS — M7521 Bicipital tendinitis, right shoulder: Secondary | ICD-10-CM | POA: Insufficient documentation

## 2024-06-18 DIAGNOSIS — J449 Chronic obstructive pulmonary disease, unspecified: Secondary | ICD-10-CM | POA: Diagnosis not present

## 2024-06-18 DIAGNOSIS — F17210 Nicotine dependence, cigarettes, uncomplicated: Secondary | ICD-10-CM | POA: Diagnosis not present

## 2024-06-18 HISTORY — DX: Unilateral inguinal hernia, without obstruction or gangrene, not specified as recurrent: K40.90

## 2024-06-18 HISTORY — PX: BICEPT TENODESIS: SHX5116

## 2024-06-18 HISTORY — PX: POSTERIOR LUMBAR FUSION 2 WITH HARDWARE REMOVAL: SHX7297

## 2024-06-18 HISTORY — DX: Personal history of transient ischemic attack (TIA), and cerebral infarction without residual deficits: Z86.73

## 2024-06-18 HISTORY — DX: Atherosclerosis of aorta: I70.0

## 2024-06-18 HISTORY — DX: Long term (current) use of aspirin: Z79.82

## 2024-06-18 HISTORY — PX: SUBACROMIAL DECOMPRESSION: SHX5174

## 2024-06-18 HISTORY — DX: Other specified postprocedural states: Z98.890

## 2024-06-18 HISTORY — PX: SHOULDER OPEN ROTATOR CUFF REPAIR: SHX2407

## 2024-06-18 HISTORY — DX: Disorder of arteries and arterioles, unspecified: I77.9

## 2024-06-18 HISTORY — DX: Atherosclerotic heart disease of native coronary artery without angina pectoris: I25.10

## 2024-06-18 SURGERY — ARTHROSCOPY, SHOULDER WITH DEBRIDEMENT
Anesthesia: General | Site: Shoulder | Laterality: Right

## 2024-06-18 MED ORDER — RINGERS IRRIGATION IR SOLN
Status: DC | PRN
Start: 2024-06-18 — End: 2024-06-18
  Administered 2024-06-18: 3000 mL

## 2024-06-18 MED ORDER — PHENYLEPHRINE HCL-NACL 20-0.9 MG/250ML-% IV SOLN
INTRAVENOUS | Status: DC | PRN
  Administered 2024-06-18: 30 ug/min via INTRAVENOUS

## 2024-06-18 MED ORDER — PROPOFOL 10 MG/ML IV BOLUS
INTRAVENOUS | Status: AC
  Filled 2024-06-18: qty 20

## 2024-06-18 MED ORDER — BUPIVACAINE-EPINEPHRINE (PF) 0.5% -1:200000 IJ SOLN
INTRAMUSCULAR | Status: DC | PRN
Start: 2024-06-18 — End: 2024-06-18
  Administered 2024-06-18: 30 mL

## 2024-06-18 MED ORDER — FENTANYL CITRATE (PF) 50 MCG/ML IJ SOSY
PREFILLED_SYRINGE | INTRAMUSCULAR | Status: AC
  Filled 2024-06-18: qty 1

## 2024-06-18 MED ORDER — KETOROLAC TROMETHAMINE 30 MG/ML IJ SOLN
INTRAMUSCULAR | Status: DC | PRN
  Administered 2024-06-18: 15 mg via INTRAVENOUS

## 2024-06-18 MED ORDER — HYDROMORPHONE HCL 2 MG PO TABS: 1.0000 mg | ORAL_TABLET | ORAL | 0 refills | Status: AC | PRN

## 2024-06-18 MED ORDER — CHLORHEXIDINE GLUCONATE 0.12 % MT SOLN
OROMUCOSAL | Status: AC
Start: 2024-06-18 — End: 2024-06-18
  Filled 2024-06-18: qty 15

## 2024-06-18 MED ORDER — PHENYLEPHRINE 80 MCG/ML (10ML) SYRINGE FOR IV PUSH (FOR BLOOD PRESSURE SUPPORT)
PREFILLED_SYRINGE | INTRAVENOUS | Status: DC | PRN
  Administered 2024-06-18 (×3): 80 ug via INTRAVENOUS

## 2024-06-18 MED ORDER — CEFAZOLIN SODIUM-DEXTROSE 2-4 GM/100ML-% IV SOLN
INTRAVENOUS | Status: AC
Start: 2024-06-18 — End: 2024-06-18
  Filled 2024-06-18: qty 100

## 2024-06-18 MED ORDER — KETOROLAC TROMETHAMINE 30 MG/ML IJ SOLN
INTRAMUSCULAR | Status: AC
  Filled 2024-06-18: qty 1

## 2024-06-18 MED ORDER — LIDOCAINE HCL (PF) 1 % IJ SOLN
INTRAMUSCULAR | Status: DC | PRN
  Administered 2024-06-18: 3 mL

## 2024-06-18 MED ORDER — BUPIVACAINE LIPOSOME 1.3 % IJ SUSP
INTRAMUSCULAR | Status: DC | PRN
  Administered 2024-06-18: 10 mL

## 2024-06-18 MED ORDER — EPINEPHRINE PF 1 MG/ML IJ SOLN
INTRAMUSCULAR | Status: DC | PRN
Start: 2024-06-18 — End: 2024-06-18
  Administered 2024-06-18: 1 mg via INTRAMUSCULAR

## 2024-06-18 MED ORDER — MIDAZOLAM HCL 2 MG/2ML IJ SOLN
INTRAMUSCULAR | Status: AC
  Filled 2024-06-18: qty 2

## 2024-06-18 MED ORDER — EPHEDRINE SULFATE-NACL 50-0.9 MG/10ML-% IV SOSY
PREFILLED_SYRINGE | INTRAVENOUS | Status: DC | PRN
  Administered 2024-06-18 (×2): 5 mg via INTRAVENOUS

## 2024-06-18 MED ORDER — BUPIVACAINE LIPOSOME 1.3 % IJ SUSP
INTRAMUSCULAR | Status: AC
  Filled 2024-06-18: qty 10

## 2024-06-18 MED ORDER — BUPIVACAINE HCL (PF) 0.5 % IJ SOLN
INTRAMUSCULAR | Status: AC
  Filled 2024-06-18: qty 10

## 2024-06-18 MED ORDER — PROPOFOL 10 MG/ML IV BOLUS
INTRAVENOUS | Status: DC | PRN
  Administered 2024-06-18: 160 mg via INTRAVENOUS

## 2024-06-18 MED ORDER — SUGAMMADEX SODIUM 200 MG/2ML IV SOLN
INTRAVENOUS | Status: DC | PRN
  Administered 2024-06-18: 200 mg via INTRAVENOUS

## 2024-06-18 MED ORDER — FENTANYL CITRATE (PF) 100 MCG/2ML IJ SOLN: 25.0000 ug | INTRAMUSCULAR | Status: DC | PRN

## 2024-06-18 MED ORDER — LIDOCAINE HCL (PF) 1 % IJ SOLN
INTRAMUSCULAR | Status: AC
  Filled 2024-06-18: qty 5

## 2024-06-18 MED ORDER — MIDAZOLAM HCL (PF) 2 MG/2ML IJ SOLN
1.0000 mg | INTRAMUSCULAR | Status: DC | PRN
  Administered 2024-06-18: 1 mg via INTRAVENOUS

## 2024-06-18 MED ORDER — EPINEPHRINE PF 1 MG/ML IJ SOLN
INTRAMUSCULAR | Status: AC
  Filled 2024-06-18: qty 1

## 2024-06-18 MED ORDER — FENTANYL CITRATE (PF) 100 MCG/2ML IJ SOLN
INTRAMUSCULAR | Status: DC | PRN
  Administered 2024-06-18: 100 ug via INTRAVENOUS

## 2024-06-18 MED ORDER — ONDANSETRON HCL 4 MG/2ML IJ SOLN
INTRAMUSCULAR | Status: AC
  Filled 2024-06-18: qty 2

## 2024-06-18 MED ORDER — TRANEXAMIC ACID-NACL 1000-0.7 MG/100ML-% IV SOLN
INTRAVENOUS | Status: AC
  Filled 2024-06-18: qty 100

## 2024-06-18 MED ORDER — ACETAMINOPHEN 10 MG/ML IV SOLN
INTRAVENOUS | Status: AC
  Filled 2024-06-18: qty 100

## 2024-06-18 MED ORDER — BUPIVACAINE HCL (PF) 0.5 % IJ SOLN
INTRAMUSCULAR | Status: DC | PRN
  Administered 2024-06-18: 10 mL

## 2024-06-18 MED ORDER — FENTANYL CITRATE (PF) 100 MCG/2ML IJ SOLN
INTRAMUSCULAR | Status: AC
  Filled 2024-06-18: qty 2

## 2024-06-18 MED ORDER — DEXAMETHASONE SOD PHOSPHATE PF 10 MG/ML IJ SOLN
INTRAMUSCULAR | Status: DC | PRN
  Administered 2024-06-18: 10 mg via INTRAVENOUS

## 2024-06-18 MED ORDER — ONDANSETRON HCL 4 MG/2ML IJ SOLN
INTRAMUSCULAR | Status: DC | PRN
  Administered 2024-06-18: 4 mg via INTRAVENOUS

## 2024-06-18 MED ORDER — BUPIVACAINE-EPINEPHRINE (PF) 0.5% -1:200000 IJ SOLN
INTRAMUSCULAR | Status: AC
  Filled 2024-06-18: qty 30

## 2024-06-18 MED ORDER — FENTANYL CITRATE (PF) 50 MCG/ML IJ SOSY
50.0000 ug | PREFILLED_SYRINGE | Freq: Once | INTRAMUSCULAR | Status: DC
Start: 2024-06-18 — End: 2024-06-18

## 2024-06-18 MED ORDER — LIDOCAINE HCL (CARDIAC) PF 100 MG/5ML IV SOSY
PREFILLED_SYRINGE | INTRAVENOUS | Status: DC | PRN
  Administered 2024-06-18: 60 mg via INTRAVENOUS

## 2024-06-18 MED ORDER — KETOROLAC TROMETHAMINE 15 MG/ML IJ SOLN: 15.0000 mg | Freq: Once | INTRAMUSCULAR | Status: DC

## 2024-06-18 MED ORDER — ACETAMINOPHEN 10 MG/ML IV SOLN
INTRAVENOUS | Status: DC | PRN
  Administered 2024-06-18: 1000 mg via INTRAVENOUS

## 2024-06-18 MED ORDER — ROCURONIUM BROMIDE 100 MG/10ML IV SOLN
INTRAVENOUS | Status: DC | PRN
  Administered 2024-06-18: 30 mg via INTRAVENOUS
  Administered 2024-06-18: 50 mg via INTRAVENOUS

## 2024-06-18 SURGICAL SUPPLY — 43 items
ANCHOR BONE REGENETEN (Anchor) IMPLANT
ANCHOR JUGGERKNOT WTAP NDL 2.9 (Anchor) IMPLANT
ANCHOR QFIX 2.8 SUT MINI TAPE (Anchor) IMPLANT
ANCHOR TENDON REGENETEN (Staple) IMPLANT
BIT DRILL JUGRKNT W/NDL BIT2.9 (DRILL) IMPLANT
BLADE FULL RADIUS 3.5 (BLADE) ×1 IMPLANT
BUR ACROMIONIZER 4.0 (BURR) ×1 IMPLANT
CHLORAPREP W/TINT 26 (MISCELLANEOUS) ×1 IMPLANT
COVER MAYO STAND STRL (DRAPES) ×1 IMPLANT
ELECTRODE REM PT RTRN 9FT ADLT (ELECTROSURGICAL) ×1 IMPLANT
GAUZE SPONGE 4X4 12PLY STRL (GAUZE/BANDAGES/DRESSINGS) ×1 IMPLANT
GAUZE XEROFORM 1X8 LF (GAUZE/BANDAGES/DRESSINGS) ×1 IMPLANT
GLOVE BIO SURGEON STRL SZ7.5 (GLOVE) ×2 IMPLANT
GLOVE BIO SURGEON STRL SZ8 (GLOVE) ×2 IMPLANT
GLOVE BIOGEL PI IND STRL 8 (GLOVE) ×1 IMPLANT
GLOVE INDICATOR 8.0 STRL GRN (GLOVE) ×1 IMPLANT
GOWN STRL REUS W/ TWL LRG LVL3 (GOWN DISPOSABLE) ×1 IMPLANT
GOWN STRL REUS W/ TWL XL LVL3 (GOWN DISPOSABLE) ×1 IMPLANT
IMPL REGENETEN MEDIUM (Shoulder) IMPLANT
IV LR IRRIG 3000ML ARTHROMATIC (IV SOLUTION) ×1 IMPLANT
KIT CANNULA 8X76-LX IN CANNULA (CANNULA) ×1 IMPLANT
KIT SUTURE 2.8 Q-FIX DISP (MISCELLANEOUS) IMPLANT
MANIFOLD NEPTUNE II (INSTRUMENTS) ×2 IMPLANT
MASK FACE SPIDER DISP (MASK) ×1 IMPLANT
MAT ABSORB FLUID 56X50 GRAY (MISCELLANEOUS) ×1 IMPLANT
PACK ARTHROSCOPY SHOULDER (MISCELLANEOUS) ×1 IMPLANT
PAD ABD DERMACEA PRESS 5X9 (GAUZE/BANDAGES/DRESSINGS) ×2 IMPLANT
PASSER SUT FIRSTPASS SELF (INSTRUMENTS) IMPLANT
PENCIL SMOKE EVACUATOR (MISCELLANEOUS) IMPLANT
SLING ARM LARGE (SOFTGOODS) ×1 IMPLANT
SLING ULTRA II LG (MISCELLANEOUS) ×1 IMPLANT
SLING ULTRA II M (MISCELLANEOUS) IMPLANT
SPONGE T-LAP 18X18 ~~LOC~~+RFID (SPONGE) ×1 IMPLANT
STAPLER SKIN PROX 35W (STAPLE) ×1 IMPLANT
STRAP SAFETY 5IN WIDE (MISCELLANEOUS) ×1 IMPLANT
SUT ETHIBOND 0 MO6 C/R (SUTURE) ×1 IMPLANT
SUT ULTRABRAID 2 COBRAID 38 (SUTURE) IMPLANT
SUT VIC AB 2-0 CT1 TAPERPNT 27 (SUTURE) ×2 IMPLANT
TAPE MICROFOAM 4IN (TAPE) ×1 IMPLANT
TRAP FLUID SMOKE EVACUATOR (MISCELLANEOUS) ×1 IMPLANT
TUBE SET DOUBLEFLO INFLOW (TUBING) ×1 IMPLANT
TUBING CONNECTING 10 (TUBING) IMPLANT
WAND WEREWOLF FLOW 90D (MISCELLANEOUS) ×1 IMPLANT

## 2024-06-18 NOTE — Anesthesia Procedure Notes (Signed)
 Anesthesia Regional Block: Interscalene brachial plexus block   Pre-Anesthetic Checklist: , timeout performed,  Correct Patient, Correct Site, Correct Laterality,  Correct Procedure, Correct Position, site marked,  Risks and benefits discussed,  Surgical consent,  Pre-op evaluation,  At surgeon's request and post-op pain management  Laterality: Right  Prep: alcohol swabs       Needles:  Injection technique: Single-shot  Needle Type: Echogenic Needle     Needle Length: 4cm  Needle Gauge: 25     Additional Needles:   Procedures:,,,, ultrasound used (permanent image in chart),,    Narrative:  Start time: 06/18/2024 1:13 PM End time: 06/18/2024 1:15 PM Injection made incrementally with aspirations every 5 mL.  Performed by: Personally  Anesthesiologist: Chesley Lendia CROME, MD  Additional Notes: Patient's chart reviewed and they were deemed appropriate candidate for procedure, at surgeon's request. Patient educated about risks, benefits, and alternatives of the block including but not limited to: temporary or permanent nerve damage, bleeding, infection, damage to surround tissues, pneumothorax, hemidiaphragmatic paralysis, unilateral Horner's syndrome, block failure, local anesthetic toxicity. Patient expressed understanding. A formal time-out was conducted consistent with institution rules.  Monitors were applied, and minimal sedation used (see nursing record). The site was prepped with skin prep and allowed to dry, and sterile gloves were used. A high frequency linear ultrasound probe with probe cover was utilized throughout. C5-7 nerve roots located and appeared anatomically normal, local anesthetic injected around them, and echogenic block needle trajectory was monitored throughout. Aspiration performed every 5ml. Lung and blood vessels were avoided. All injections were performed without resistance and free of blood and paresthesias. The patient tolerated the procedure well.  Injectate:  10ml exparel  10 0.5% bupi

## 2024-06-18 NOTE — Transfer of Care (Signed)
 Immediate Anesthesia Transfer of Care Note  Patient: Tommy Avila  Procedure(s) Performed: ARTHROSCOPY, SHOULDER WITH DEBRIDEMENT (Right: Shoulder) DECOMPRESSION, SUBACROMIAL SPACE (Right: Shoulder) REPAIR, ROTATOR CUFF, OPEN (Right: Shoulder) TENODESIS, BICEPS (Right: Shoulder)  Patient Location: PACU  Anesthesia Type:General  Level of Consciousness: drowsy  Airway & Oxygen Therapy: Patient Spontanous Breathing and Patient connected to face mask oxygen  Post-op Assessment: Report given to RN and Post -op Vital signs reviewed and stable  Post vital signs: Reviewed and stable  Last Vitals:  Vitals Value Taken Time  BP 128/63   Temp    Pulse 75   Resp 18   SpO2 98     Last Pain:  Vitals:   06/18/24 1057  TempSrc: Temporal  PainSc: 0-No pain         Complications: No notable events documented.

## 2024-06-18 NOTE — Anesthesia Procedure Notes (Addendum)
 Procedure Name: Intubation Date/Time: 06/18/2024 1:31 PM  Performed by: Veronica Alm BROCKS, CRNAPre-anesthesia Checklist: Patient identified, Patient being monitored, Timeout performed, Emergency Drugs available and Suction available Patient Re-evaluated:Patient Re-evaluated prior to induction Oxygen Delivery Method: Circle system utilized Preoxygenation: Pre-oxygenation with 100% oxygen Induction Type: IV induction Ventilation: Mask ventilation without difficulty and Oral airway inserted - appropriate to patient size Laryngoscope Size: McGrath and 4 Grade View: Grade I Tube type: Oral Tube size: 7.5 mm Number of attempts: 1 Airway Equipment and Method: Stylet Placement Confirmation: ETT inserted through vocal cords under direct vision, positive ETCO2 and breath sounds checked- equal and bilateral Secured at: 21 cm Tube secured with: Tape Dental Injury: Teeth and Oropharynx as per pre-operative assessment  Comments: Atraumatic intubation

## 2024-06-18 NOTE — Discharge Instructions (Addendum)
 Orthopedic discharge instructions: Keep dressing dry and intact.  May shower after dressing changed on post-op day #4 (Saturday).  Cover staples/sutures with Band-Aids after drying off. Apply ice frequently to shoulder. Take ibuprofen  600-800 mg TID with meals for 3-5 days, then as necessary. Take pain medication as prescribed when needed.  May supplement with ES Tylenol  if necessary. Keep shoulder immobilizer on at all times except may remove for bathing purposes. Follow-up in 10-14 days or as scheduled.  SHOULDER SLING IMMOBILIZER   VIDEO Slingshot 2 Shoulder Brace Application - YouTube ---[email or web address]  INSTRUCTIONS While supporting the injured arm, slide the forearm into the sling. Wrap the adjustable shoulder strap around the neck and shoulders and attach the strap end to the sling using  the "alligator strap tab."  Adjust the shoulder strap to the required length. Position the shoulder pad behind the neck. To secure the shoulder pad location (optional), pull the shoulder strap away from the shoulder pad, unfold the hook material on the top of the pad, then press the shoulder strap back onto the hook material to secure the pad in place. Attach the closure strap across the open top of the sling. Position the strap so that it holds the arm securely in the sling. Next, attach the thumb strap to the open end of the sling between the thumb and fingers. After sling has been fit, it may be easily removed and reapplied using the quick release buckle on shoulder strap. If a neutral pillow or 15 abduction pillow is included, place the pillow at the waistline. Attach the sling to the pillow, lining up hook material on the pillow with the loop on sling. Adjust the waist strap to fit.  If waist strap is too long, cut it to fit. Use the small piece of double sided hook material (located on top of the pillow) to secure the strap end. Place the double sided hook material  on the inside of the cut strap end and secure it to the waist strap.     If no pillow is included, attach the waist strap to the sling and adjust to fit.    Washing Instructions: Straps and sling must be removed and cleaned regularly depending on your activity level and perspiration. Hand wash straps and sling in cold water with mild detergent, rinse, air dry      Interscalene Nerve Block (ISNB) Discharge Instructions    For your surgery you have received an Interscalene Nerve Block. Nerve Blocks affect many types of nerves, including nerves that control movement, pain and normal sensation.  You may experience feelings such as numbness, tingling, heaviness, weakness or the inability to move your arm or the feeling or sensation that your arm has fallen asleep. A nerve block can last for 2 - 36 hours or more depending on the medication used.  Usually the weakness wears off first.  The tingling and heaviness usually wear off next.  Finally you may start to notice pain.  Keep in mind that this may occur in any order.  once a nerve block starts to wear off it is usually completely gone within 60 minutes. ISNB may cause mild shortness of breath, a hoarse voice, blurry vision, unequal pupils, or drooping of the face on the same side as the nerve block.  These symptoms will usually go away within 12 hours.  Very rarely the procedure itself can cause mild seizures. If needed, your surgeon will give you a prescription for pain medication.  It will take  about 60 minutes for the oral pain medication to become fully effective.  So, it is recommended that you start taking this medication before the nerve block first begins to wear off, or when you first begin to feel discomfort. Keep in mind that nerve blocks often wear off in the middle of the night.   If you are going to bed and the block has not started to wear off or you have not started to have any discomfort, consider setting an alarm for 2 to 3 hours, so  you can assess your block.  If you notice the block is wearing off or you are starting to have discomfort, you can take your pain medication. Take your pain medication only as prescribed.  Pain medication can cause sedation and decrease your breathing if you take more than you need for the level of pain that you have. Nausea is a common side effect of many pain medications.  You may want to eat something before taking your pain medicine to prevent nausea. After an Interscalene nerve block, you cannot feel pain, pressure or extremes in temperature in the effected arm.  Because your arm is numb it is at an increased risk for injury.  To decrease the possibility of injury, please practice the following:  While you are awake change the position of your arm frequently to prevent too much pressure on any one area for prolonged periods of time.  If you have a cast or tight dressing, check the color or your fingers every couple of hours.  Call your surgeon with the appearance of any discoloration (white or blue). If you are given a sling to wear before you go home, please wear it  at all times until the block has completely worn off.  Do not get up at night without your sling. If you experience any problems or concerns, please contact your surgeon's office. If you experience severe or prolonged shortness of breath go to the nearest emergency department.Interscalene Nerve Block (ISNB) Discharge Instructions    For your surgery you have received an Interscalene Nerve Block. Nerve Blocks affect many types of nerves, including nerves that control movement, pain and normal sensation.  You may experience feelings such as numbness, tingling, heaviness, weakness or the inability to move your arm or the feeling or sensation that your arm has fallen asleep. A nerve block can last for 2 - 36 hours or more depending on the medication used.  Usually the weakness wears off first.  The tingling and heaviness usually wear  off next.  Finally you may start to notice pain.  Keep in mind that this may occur in any order.  once a nerve block starts to wear off it is usually completely gone within 60 minutes. ISNB may cause mild shortness of breath, a hoarse voice, blurry vision, unequal pupils, or drooping of the face on the same side as the nerve block.  These symptoms will usually go away within 12 hours.  Very rarely the procedure itself can cause mild seizures. If needed, your surgeon will give you a prescription for pain medication.  It will take about 60 minutes for the oral pain medication to become fully effective.  So, it is recommended that you start taking this medication before the nerve block first begins to wear off, or when you first begin to feel discomfort. Keep in mind that nerve blocks often wear off in the middle of the night.   If you are going to bed and  the block has not started to wear off or you have not started to have any discomfort, consider setting an alarm for 2 to 3 hours, so you can assess your block.  If you notice the block is wearing off or you are starting to have discomfort, you can take your pain medication. Take your pain medication only as prescribed.  Pain medication can cause sedation and decrease your breathing if you take more than you need for the level of pain that you have. Nausea is a common side effect of many pain medications.  You may want to eat something before taking your pain medicine to prevent nausea. After an Interscalene nerve block, you cannot feel pain, pressure or extremes in temperature in the effected arm.  Because your arm is numb it is at an increased risk for injury.  To decrease the possibility of injury, please practice the following:  While you are awake change the position of your arm frequently to prevent too much pressure on any one area for prolonged periods of time.  If you have a cast or tight dressing, check the color or your fingers every couple of  hours.  Call your surgeon with the appearance of any discoloration (white or blue). If you are given a sling to wear before you go home, please wear it  at all times until the block has completely worn off.  Do not get up at night without your sling. If you experience any problems or concerns, please contact your surgeon's office. If you experience severe or prolonged shortness of breath go to the nearest emergency department.

## 2024-06-18 NOTE — Anesthesia Postprocedure Evaluation (Signed)
 Anesthesia Post Note  Patient: Tommy Avila  Procedure(s) Performed: ARTHROSCOPY, SHOULDER WITH DEBRIDEMENT (Right: Shoulder) DECOMPRESSION, SUBACROMIAL SPACE (Right: Shoulder) REPAIR, ROTATOR CUFF, OPEN (Right: Shoulder) TENODESIS, BICEPS (Right: Shoulder)  Patient location during evaluation: PACU Anesthesia Type: General Level of consciousness: awake and alert Pain management: pain level controlled Vital Signs Assessment: post-procedure vital signs reviewed and stable Respiratory status: spontaneous breathing, nonlabored ventilation, respiratory function stable and patient connected to nasal cannula oxygen Cardiovascular status: blood pressure returned to baseline and stable Postop Assessment: no apparent nausea or vomiting Anesthetic complications: no   No notable events documented.   Last Vitals:  Vitals:   06/18/24 1545 06/18/24 1600  BP: 103/60 130/72  Pulse: 73 67  Resp: 16 14  Temp:  (!) 36.1 C  SpO2: 100% 100%    Last Pain:  Vitals:   06/18/24 1600  TempSrc:   PainSc: 0-No pain                 Debby Mines

## 2024-06-18 NOTE — Op Note (Signed)
 06/18/2024  3:32 PM  Patient:   Tommy Avila  Pre-Op Diagnosis:   Impingement/tendinopathy with partial-thickness rotator cuff tear and biceps tendinopathy, right shoulder.  Post-Op Diagnosis:   Impingement/tendinopathy with partial-thickness rotator cuff tear, degenerative labral fraying, and biceps tendinopathy, right shoulder.  Procedure:   Extensive arthroscopic debridement, arthroscopic subacromial decompression, mini-open rotator cuff repair, and mini-open biceps tenodesis, right shoulder.  Anesthesia:   General endotracheal with interscalene block using Exparel  placed preoperatively by the anesthesiologist.  Surgeon:   DOROTHA Reyes Maltos, MD  Assistant:   HILLARY Hummer, PA-S  Findings:   As above.  There was a longitudinal split tear of the superior insertional fibers of the subscapularis tendon without compromise of the footprint.  There was an an extensive partial-thickness bursal surface tear of the anterior and mid insertional fibers of the supraspinatus tendon, as well as degenerative tearing of the articular fibers of the anterior and mid supraspinatus tendon insertion.  The remainder of the rotator cuff was in satisfactory condition.  There was extensive partial-thickness tearing of the biceps tendon.  The articular surfaces of the glenoid and humerus both were in satisfactory condition.  Complications:   None  Estimated blood loss:   10 cc  Fluids:   500 cc  Tourniquet time:   None  Drains:   None  Closure:   Staples      Brief clinical note:   The patient is a 73 year old male with a history of progressively worsening right shoulder pain. The patient's symptoms have progressed despite medications, activity modification, etc. The patient's history and examination are consistent with impingement/tendinopathy with a rotator cuff tear. These findings were confirmed by MRI scan. The patient presents at this time for definitive management of these shoulder  symptoms.  Procedure:   The patient underwent placement of an interscalene block using Exparel  by the anesthesiologist in the preoperative holding area before being brought into the operating room and lain in the supine position. The patient then underwent general endotracheal intubation and anesthesia before being repositioned in the beach chair position using the beach chair positioner. The right shoulder and upper extremity were prepped with ChloraPrep solution before being draped sterilely. Preoperative antibiotics were administered. A timeout was performed to confirm the proper surgical site before the expected portal sites and incision site were injected with 0.5% Sensorcaine  with epinephrine .   A posterior portal was created and the glenohumeral joint thoroughly inspected with the findings as described above. An anterior portal was created using an outside-in technique. The labrum and rotator cuff were further probed, again confirming the above-noted findings. The areas of labral fraying were debrided back to stable margins using the full-radius resector, as were the torn margins of the rotator cuff. In addition, areas of significant synovitis were debrided back to stable margins using the full-radius resector. Finally, the torn portion of the biceps tendon was debrided back to stable margins using the full-radius resector before the ArthroCare wand was inserted and used to release the biceps tendon from its labral anchor. It also was used to obtain hemostasis as well as to anneal the labrum superiorly and anteriorly. The instruments were removed from the joint after suctioning the excess fluid.  The camera was repositioned through the posterior portal into the subacromial space. A separate lateral portal was created using an outside-in technique. The 3.5 mm full-radius resector was introduced and used to perform a subtotal bursectomy. The ArthroCare wand was then inserted and used to remove the  periosteal tissue off  the undersurface of the anterior third of the acromion as well as to recess the coracoacromial ligament from its attachment along the anterior and lateral margins of the acromion. The 4.0 mm acromionizing bur was introduced and used to complete the decompression by removing the undersurface of the anterior third of the acromion. The full radius resector was reintroduced to remove any residual bony debris before the ArthroCare wand was reintroduced to obtain hemostasis. The instruments were then removed from the subacromial space after suctioning the excess fluid.  An approximately 4-5 cm incision was made over the anterolateral aspect of the shoulder beginning at the anterolateral corner of the acromion and extending distally in line with the bicipital groove. This incision was carried down through the subcutaneous tissues to expose the deltoid fascia. The raphae between the anterior and middle thirds was identified and this plane developed to provide access into the subacromial space. Additional bursal tissues were debrided sharply using Metzenbaum scissors. The rotator cuff tear was readily identified. The tissue was noted to be quite degenerative and of poor quality.   The bicipital groove was identified by palpation and opened for 1-1.5 cm. The biceps tendon stump was retrieved through this defect. The floor of the bicipital groove was roughened with a curet before a Biomet 2.9 mm JuggerKnot anchor was inserted. Both sets of sutures were passed through the biceps tendon and tied securely to effect the tenodesis. The bicipital sheath was reapproximated using two #0 Ethibond interrupted sutures, incorporating the biceps tendon to further reinforce the tenodesis.  The margins of the rotator cuff tear were debrided sharply with a #15 blade and the exposed greater tuberosity roughened with a rongeur. The tear was repaired using a single Smith [employer] Nephew 2.8 mm Q-Fix anchor.  Additional #0  Ethibond interrupted sutures were placed in a side-to-side fashion to reinforce this repair. An apparent watertight closure was obtained.  Given the poor quality of the tissue, it was felt best to reinforce this repair with a Smith & Nephew Regeneten patch to better optimize healing potential.  Therefore, a medium size patch was selected and applied over the repair site and secured using the appropriate bone and soft tissue staples.  This procedure added an extra measure of complexity to the case, as well as adding an additional 20 to 30 minutes to the overall surgical time.  The wound was copiously irrigated with sterile saline solution before the deltoid raphae was reapproximated using 2-0 Vicryl interrupted sutures. The subcutaneous tissues were closed in two layers using 2-0 Vicryl interrupted sutures before the skin was closed using staples. The portal sites also were closed using staples. A sterile bulky dressing was applied to the shoulder before the arm was placed into a shoulder immobilizer. The patient was then awakened, extubated, and returned to the recovery room in satisfactory condition after tolerating the procedure well.

## 2024-06-18 NOTE — H&P (Signed)
 History of Present Illness:  Tommy Avila is a 73 y.o. male who has severe Right shoulder pain. Patient reports ongoing right shoulder pain that has previously been evaluated by Gustavo Level. The patient underwent an MRI scan that showed moderate underlying tendinosis with potential tearing of the rotator cuff. He continues to endorse pain that localizes along the lateral and posterior margins of his right shoulder. He denies having any numbness, tingling or radiation symptoms down his arm. It severely limits his ability to perform his ADLs and use his arm as he would like to. Patient is noted to have a history of a stroke and other cardiac abnormalities and was previously scheduled for surgery that was canceled secondary to needing updated cardiac evaluation. He states that he has seen his cardiologist and is ready to proceed with surgery. He has failed conservative treatment including tylenol , injections and NSAIDs. He has requested operative intervention for relief of his symptoms. Patient denies any previous cardiac history, but underwent a recent CT angiogram which seemed to result without any issue. He does report history of COPD. No previous DVTs or clots. He is not diabetic. No previous surgeries on his right shoulder although does report multiple left shoulder operations.  Social Hx: Patient lives at home alone, but states he will be having his knees look after him postoperatively. He works for an [employer] and [employer] and [employer]. He admits to about 1 standard drink a week. Denies any illicit drug use. Does admit to smoking, states he smokes about 1/2 pack a week.  Allergies: Oxycodone Hallucination and Other (Hallucinations)   Medicines: acetaminophen  (TYLENOL ) 500 MG tablet Take by mouth  albuterol  90 mcg/actuation inhaler Inhale 2 inhalations into the lungs every 4 (four) hours as needed.  aspirin 81 MG EC tablet Take 81 mg by mouth once daily.  atorvastatin  (LIPITOR) 20 MG tablet TAKE 1 TABLET BY MOUTH AT BEDTIME FOR HIGH CHOLESTEROL  beclomethasone (QVAR) 80 mcg/actuation inhaler Inhale into the lungs 2 (two) times daily.  calcium  carbonate 500 mg calcium  (1,250 mg) tablet Take 1 tablet by mouth at bedtime  celecoxib  (CELEBREX ) 200 MG capsule TAKE 1 CAPSULE BY MOUTH TWICE A DAY 60 capsule 0  HYDROcodone -acetaminophen  (NORCO) 5-325 mg tablet Take 0.5-1 tablets by mouth every 8 (eight) hours as needed for Pain 15 tablet 0  lidocaine  2 % solution Use as directed 15 mLs in the mouth or throat every 6 (six) hours as needed for mouth pain.  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 Edie)  Left total hip arthroplasty. Left 09/01/2022 (Dr. Edie)  Back surgery  EXCISION GANGLION CYST WRIST PRIMARY   Physical Exam:  Ht: Wt: BMI: There is no height or weight on file to calculate BMI.  General/Constitutional: No apparent distress: well-nourished and well developed. Eyes: Pupils equal, round with synchronous movement. Lymphatic: No palpable adenopathy. Respiratory: Patient has good chest rise and fall with inspiration  and expiration. All lung fields are clear to auscultation bilaterally. There is no Rales, rhonchi or wheezes appreciated. Cardiovascular: Upon auscultation there is a regular rate and rhythm without any murmurs, rubs, gallops or heaves appreciated. There does not appear to be any swelling  down the lower extremities. Posterior tibial pulses appreciated bilaterally, 2+. Integumentary: No impressive skin lesions present, except as noted in detailed exam. Neuro/Psych: Normal mood and affect, oriented to person, place and time. Musculoskeletal: see exam below  Right Shoulder: Upon inspection of the patient's right shoulder there is no noticeable swelling, erythema, ecchymosis or gross deformity appreciated. No tenting of the skin noted.  Shoulder contour: Normal Tenderness: Subacromial tenderness. Impingement test: positive Apprehension sign: negative Crepitance: negative Atrophy: No atrophy. Limited strength with both internal and external rotation of the shoulder against resistance. Liftoff test: Performed with difficulty Empty can test: Elicits a fair amount of discomfort with 4/5 strength Speed's Test: Elicits discomfort with fair strength Range of Motion:  EXT/FLEX: -/100  ADD/ABD: -/90  IR/ER at 90 degrees abduction: 60/70   Patient is neurovascularly intact to all dermatomes extending down their right upper extremity and into their hand and fingers. Radial pulses appreciated, 2+. Cap refill less than 2 seconds in each finger.  Imaging: MRI Right Shoulder:  1. Moderate-severe supraspinatus tendinosis with fraying along the  anterior bursal surface.  2. Moderate infraspinatus tendinosis.  3. Mild subscapularis tendinosis.  4. Moderate tendinosis of the intra-articular portion of the long  head of the biceps tendon.  5. Mild subacromial/subdeltoid bursitis.   Assessment:  1. Moderate to severe right shoulder rotator cuff tendinopathy with interstitial and bursal surface tearing. 2. Right biceps tendinitis.  Plan: The treatment options, including both surgical and nonsurgical choices, have been discussed in detail with the patient.  The patient would like to proceed with surgical intervention to include a right shoulder arthroscopy with debridement, decompression,  possible rotator cuff repair, and probable biceps tenodesis.  The risks (including bleeding, infection, nerve and/or blood vessel injury, persistent or recurrent pain, weakness and/or stiffness of the shoulder, recurrent tear, failure of the repair, need for further surgery, blood clots, strokes, heart attacks or arrhythmias, pneumonia, etc.) and benefits of the surgical procedure were discussed.  The patient states his understanding and agrees to proceed.  A formal written consent will be obtained by the nursing staff.   H&P reviewed and patient re-examined. No changes.

## 2024-06-18 NOTE — Anesthesia Preprocedure Evaluation (Addendum)
 Anesthesia Evaluation  Patient identified by MRN, date of birth, ID band Patient awake    Reviewed: Allergy & Precautions, NPO status , Patient's Chart, lab work & pertinent test results  History of Anesthesia Complications Negative for: history of anesthetic complications  Airway Mallampati: III  TM Distance: >3 FB Neck ROM: full    Dental  (+) Edentulous Upper, Edentulous Lower   Pulmonary COPD, Current Smoker   Pulmonary exam normal        Cardiovascular + CAD and + Peripheral Vascular Disease  Normal cardiovascular exam     Neuro/Psych  Neuromuscular disease CVA  negative psych ROS   GI/Hepatic negative GI ROS, Neg liver ROS,,,  Endo/Other  negative endocrine ROS    Renal/GU      Musculoskeletal   Abdominal   Peds  Hematology negative hematology ROS (+)   Anesthesia Other Findings Past Medical History: No date: Aortic atherosclerosis No date: Arthritis No date: Bilateral carotid artery disease No date: CAD (coronary artery disease) No date: COPD (chronic obstructive pulmonary disease) (HCC) No date: Dyspnea No date: High cholesterol 10/13/2021: History of 2019 novel coronavirus disease (COVID-19)     Comment:  a.) tested (+) via home test. 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 No date: Status post right rotator cuff repair 12/31/2023: Stroke (cerebrum) (HCC)     Comment:  a.) punctate acute/subacute cortical infarct within the               LEFT parietal lobe No date: Vertebral artery stenosis, left  Past Surgical History: No date: BACK SURGERY     Comment:  lumbar bulging disc 06/15/2021: COLONOSCOPY WITH PROPOFOL ; N/A     Comment:  Procedure: COLONOSCOPY WITH PROPOFOL ;  Surgeon: Jinny Carmine, MD;  Location: [facility];  Service:               Endoscopy;  Laterality: N/A; No date: GANGLION CYST  EXCISION     Comment:  wrist 05/22/2019: INGUINAL HERNIA REPAIR; Left     Comment:  Procedure: HERNIA REPAIR INGUINAL ADULT;  Surgeon:               Marolyn Nest, MD;  Location: [facility];  Service:               General;  Laterality: Left; No date: SHOULDER SURGERY; Left     Comment:  x5 11/02/2021: TOTAL HIP ARTHROPLASTY; Right     Comment:  Procedure: TOTAL HIP ARTHROPLASTY;  Surgeon: Edie Norleen PARAS, MD;  Location: [facility];  Service: Orthopedics;                Laterality: Right; 09/01/2022: TOTAL HIP ARTHROPLASTY; Left     Comment:  Procedure: TOTAL HIP ARTHROPLASTY;  Surgeon: Edie Norleen PARAS, MD;  Location: [facility];  Service: Orthopedics;                Laterality: Left; No date: TOTAL SHOULDER ARTHROPLASTY; Left     Reproductive/Obstetrics negative OB ROS                              Anesthesia Physical Anesthesia Plan  ASA: 3  Anesthesia Plan: General ETT   Post-op Pain Management: Toradol  IV (intra-op)*, Ofirmev  IV (intra-op)* and Regional block*   Induction: Intravenous  PONV Risk Score and Plan: 2 and Ondansetron , Dexamethasone , Midazolam  and Treatment may vary due to age or medical condition  Airway Management Planned: Oral ETT  Additional Equipment:   Intra-op Plan:   Post-operative Plan: Extubation in OR  Informed Consent: I have reviewed the patients History and Physical, chart, labs and discussed the procedure including the risks, benefits and alternatives for the proposed anesthesia with the patient or authorized representative who has indicated his/her understanding and acceptance.     Dental Advisory Given  Plan Discussed with: Anesthesiologist, CRNA and Surgeon  Anesthesia Plan Comments: (Patient consented for risks of anesthesia including but not limited to:  - adverse reactions to medications - damage to eyes, teeth, lips or other oral mucosa - nerve damage due to positioning  - sore  throat or hoarseness - Damage to heart, brain, nerves, lungs, other parts of body or loss of life  Patient voiced understanding and assent.)         Anesthesia Quick Evaluation

## 2024-06-19 ENCOUNTER — Encounter: Payer: Self-pay | Admitting: Surgery

## 2024-06-19 NOTE — Addendum Note (Signed)
 Addendum  created 06/19/24 1539 by Chesley Lendia CROME, MD   Intraprocedure Staff edited

## 2024-06-20 NOTE — Addendum Note (Signed)
 Addendum  created 06/20/24 0705 by Leavy Ned, MD   Intraprocedure Staff edited

## 2024-08-20 ENCOUNTER — Encounter: Payer: Self-pay | Admitting: Surgery

## 2024-09-19 ENCOUNTER — Other Ambulatory Visit: Payer: Self-pay | Admitting: Student

## 2024-09-19 DIAGNOSIS — M75111 Incomplete rotator cuff tear or rupture of right shoulder, not specified as traumatic: Secondary | ICD-10-CM

## 2024-09-19 DIAGNOSIS — M24111 Other articular cartilage disorders, right shoulder: Secondary | ICD-10-CM

## 2024-09-19 DIAGNOSIS — M7581 Other shoulder lesions, right shoulder: Secondary | ICD-10-CM

## 2024-09-19 DIAGNOSIS — Z9889 Other specified postprocedural states: Secondary | ICD-10-CM

## 2024-09-22 ENCOUNTER — Ambulatory Visit: Admission: RE | Admit: 2024-09-22
